# Patient Record
Sex: Female | Born: 1960 | Race: White | Hispanic: No | Marital: Single | Smoking: Former smoker
Health system: Southern US, Community
[De-identification: ages and names within clinical notes are randomized; demographics above are authoritative.]

## PROBLEM LIST (undated history)

## (undated) DIAGNOSIS — E119 Type 2 diabetes mellitus without complications: Secondary | ICD-10-CM

## (undated) DIAGNOSIS — J309 Allergic rhinitis, unspecified: Secondary | ICD-10-CM

## (undated) DIAGNOSIS — G4733 Obstructive sleep apnea (adult) (pediatric): Secondary | ICD-10-CM

## (undated) DIAGNOSIS — F32A Depression, unspecified: Secondary | ICD-10-CM

## (undated) DIAGNOSIS — R112 Nausea with vomiting, unspecified: Secondary | ICD-10-CM

## (undated) DIAGNOSIS — F419 Anxiety disorder, unspecified: Secondary | ICD-10-CM

## (undated) DIAGNOSIS — H5210 Myopia, unspecified eye: Secondary | ICD-10-CM

## (undated) DIAGNOSIS — J45909 Unspecified asthma, uncomplicated: Secondary | ICD-10-CM

## (undated) DIAGNOSIS — H409 Unspecified glaucoma: Secondary | ICD-10-CM

## (undated) DIAGNOSIS — Z6841 Body Mass Index (BMI) 40.0 and over, adult: Secondary | ICD-10-CM

## (undated) DIAGNOSIS — E559 Vitamin D deficiency, unspecified: Secondary | ICD-10-CM

## (undated) DIAGNOSIS — J9801 Acute bronchospasm: Secondary | ICD-10-CM

## (undated) DIAGNOSIS — M1990 Unspecified osteoarthritis, unspecified site: Secondary | ICD-10-CM

## (undated) DIAGNOSIS — Z9889 Other specified postprocedural states: Secondary | ICD-10-CM

## (undated) DIAGNOSIS — F329 Major depressive disorder, single episode, unspecified: Secondary | ICD-10-CM

## (undated) DIAGNOSIS — I10 Essential (primary) hypertension: Secondary | ICD-10-CM

## (undated) DIAGNOSIS — F418 Other specified anxiety disorders: Secondary | ICD-10-CM

## (undated) DIAGNOSIS — Z9989 Dependence on other enabling machines and devices: Secondary | ICD-10-CM

## (undated) HISTORY — DX: Type 2 diabetes mellitus without complications: E11.9

## (undated) HISTORY — DX: Dependence on other enabling machines and devices: Z99.89

## (undated) HISTORY — DX: Unspecified osteoarthritis, unspecified site: M19.90

## (undated) HISTORY — DX: Other specified anxiety disorders: F41.8

## (undated) HISTORY — DX: Obstructive sleep apnea (adult) (pediatric): G47.33

## (undated) HISTORY — DX: Vitamin D deficiency, unspecified: E55.9

## (undated) HISTORY — DX: Essential (primary) hypertension: I10

## (undated) HISTORY — PX: OTHER SURGICAL HISTORY: SHX169

## (undated) HISTORY — DX: Acute bronchospasm: J98.01

## (undated) HISTORY — PX: KNEE SURGERY: SHX244

## (undated) HISTORY — DX: Body Mass Index (BMI) 40.0 and over, adult: Z684

## (undated) HISTORY — DX: Morbid (severe) obesity due to excess calories: E66.01

## (undated) HISTORY — DX: Myopia, unspecified eye: H52.10

## (undated) HISTORY — DX: Allergic rhinitis, unspecified: J30.9

---

## 1997-02-03 HISTORY — PX: CHOLECYSTECTOMY: SHX55

## 1998-01-04 ENCOUNTER — Encounter: Admission: RE | Admit: 1998-01-04 | Discharge: 1998-01-04 | Payer: Self-pay | Admitting: Internal Medicine

## 1998-06-20 ENCOUNTER — Other Ambulatory Visit: Admission: RE | Admit: 1998-06-20 | Discharge: 1998-06-20 | Payer: Self-pay | Admitting: Obstetrics & Gynecology

## 1998-11-06 ENCOUNTER — Encounter: Payer: Self-pay | Admitting: Obstetrics & Gynecology

## 1998-11-06 ENCOUNTER — Ambulatory Visit (HOSPITAL_COMMUNITY): Admission: RE | Admit: 1998-11-06 | Discharge: 1998-11-06 | Payer: Self-pay | Admitting: Obstetrics & Gynecology

## 1999-02-04 HISTORY — PX: TUBAL LIGATION: SHX77

## 1999-11-26 ENCOUNTER — Other Ambulatory Visit: Admission: RE | Admit: 1999-11-26 | Discharge: 1999-11-26 | Payer: Self-pay | Admitting: Obstetrics & Gynecology

## 2000-09-25 ENCOUNTER — Ambulatory Visit (HOSPITAL_COMMUNITY): Admission: RE | Admit: 2000-09-25 | Discharge: 2000-09-25 | Payer: Self-pay | Admitting: Obstetrics & Gynecology

## 2000-12-01 ENCOUNTER — Other Ambulatory Visit: Admission: RE | Admit: 2000-12-01 | Discharge: 2000-12-01 | Payer: Self-pay | Admitting: Obstetrics & Gynecology

## 2001-11-05 ENCOUNTER — Other Ambulatory Visit: Admission: RE | Admit: 2001-11-05 | Discharge: 2001-11-05 | Payer: Self-pay | Admitting: Obstetrics & Gynecology

## 2003-01-05 ENCOUNTER — Other Ambulatory Visit: Admission: RE | Admit: 2003-01-05 | Discharge: 2003-01-05 | Payer: Self-pay | Admitting: Obstetrics & Gynecology

## 2003-02-21 ENCOUNTER — Ambulatory Visit (HOSPITAL_COMMUNITY): Admission: RE | Admit: 2003-02-21 | Discharge: 2003-02-21 | Payer: Self-pay | Admitting: Obstetrics & Gynecology

## 2003-04-25 ENCOUNTER — Other Ambulatory Visit: Admission: RE | Admit: 2003-04-25 | Discharge: 2003-04-25 | Payer: Self-pay | Admitting: Obstetrics & Gynecology

## 2004-03-13 ENCOUNTER — Other Ambulatory Visit: Admission: RE | Admit: 2004-03-13 | Discharge: 2004-03-13 | Payer: Self-pay | Admitting: Obstetrics & Gynecology

## 2004-07-18 ENCOUNTER — Encounter: Admission: RE | Admit: 2004-07-18 | Discharge: 2004-07-18 | Payer: Self-pay | Admitting: Obstetrics & Gynecology

## 2004-09-30 ENCOUNTER — Other Ambulatory Visit: Admission: RE | Admit: 2004-09-30 | Discharge: 2004-09-30 | Payer: Self-pay | Admitting: Obstetrics & Gynecology

## 2005-03-24 ENCOUNTER — Other Ambulatory Visit: Admission: RE | Admit: 2005-03-24 | Discharge: 2005-03-24 | Payer: Self-pay | Admitting: Obstetrics & Gynecology

## 2005-07-09 ENCOUNTER — Encounter: Admission: RE | Admit: 2005-07-09 | Discharge: 2005-07-09 | Payer: Self-pay | Admitting: Obstetrics & Gynecology

## 2006-04-18 ENCOUNTER — Emergency Department (HOSPITAL_COMMUNITY): Admission: EM | Admit: 2006-04-18 | Discharge: 2006-04-18 | Payer: Self-pay | Admitting: Family Medicine

## 2006-07-15 ENCOUNTER — Encounter: Admission: RE | Admit: 2006-07-15 | Discharge: 2006-07-15 | Payer: Self-pay | Admitting: Obstetrics & Gynecology

## 2007-10-07 ENCOUNTER — Encounter: Admission: RE | Admit: 2007-10-07 | Discharge: 2007-11-30 | Payer: Self-pay | Admitting: Orthopedic Surgery

## 2008-05-24 ENCOUNTER — Encounter: Admission: RE | Admit: 2008-05-24 | Discharge: 2008-07-06 | Payer: Self-pay | Admitting: Orthopedic Surgery

## 2009-02-03 HISTORY — PX: DILATION AND CURETTAGE OF UTERUS: SHX78

## 2009-09-28 ENCOUNTER — Ambulatory Visit (HOSPITAL_COMMUNITY): Admission: RE | Admit: 2009-09-28 | Discharge: 2009-09-28 | Payer: Self-pay | Admitting: Obstetrics & Gynecology

## 2010-02-24 ENCOUNTER — Encounter: Payer: Self-pay | Admitting: Obstetrics & Gynecology

## 2010-02-25 ENCOUNTER — Encounter: Payer: Self-pay | Admitting: Obstetrics & Gynecology

## 2010-04-18 LAB — BASIC METABOLIC PANEL
Calcium: 9.5 mg/dL (ref 8.4–10.5)
Creatinine, Ser: 0.59 mg/dL (ref 0.4–1.2)
GFR calc Af Amer: >60 mL/min (ref >60)
GFR calc non Af Amer: >60 mL/min (ref >60)
Glucose, Bld: 104 mg/dL — ABNORMAL HIGH (ref 70–99)
Sodium: 138 mEq/L (ref 135–145)

## 2010-04-18 LAB — CBC
MCH: 31.3 pg (ref 26.0–34.0)
MCHC: 34.5 g/dL (ref 30.0–36.0)
RDW: 13.4 % (ref 11.5–15.5)

## 2010-06-21 NOTE — Op Note (Signed)
[facility]  Patient:    Kim Brock, Kim Brock Visit Number: [id number] MRN: [id number]          Service Type: DSU Location: [facility] Attending Physician:  Minette Headland Proc. Date: 09/25/00 Adm. Date:  09/25/2000                             Operative Report  PREOPERATIVE DIAGNOSES:       Request for surgical sterilization.  POSTOPERATIVE DIAGNOSES:      Request for surgical sterilization with findings of small intramural leiomyomata.  PROCEDURE:                    Laparoscopic tubal ligation with application of ______ clips.  SURGEON:                      Freddy Finner, M.D.  ANESTHESIA:                   General.  INTRAOPERATIVE COMPLICATIONS: None.  ESTIMATED BLOOD LOSS:         5 cc.  HISTORY:                      Patient is a 50 year old divorced female gravida 1, para 1 who has definitively decided that she does not wish to ever have another pregnancy.  She is involved in a relationship at the present time and does not want any risk of pregnancy.  She has requested sterilization and is admitted at this time for that purpose.  FINDINGS:                     Irregular enlargement of the uterus to approximately 8-9 weeks size most consistent with small intramural leiomyomata.  No other visible abnormalities were noted in the pelvis.  The appendix could not be visualized ______ technique.  No apparent abnormalities were noted in the upper abdomen.  Findings recorded and still photographs which were retained in the office record.  Patient was admitted on the morning of surgery, brought to the operating room, placed under adequate general endotracheal anesthesia.  Placed in the dorsal lithotomy position.  Betadine prep was carried out in the usual fashion. Bladder was evacuated with sterile technique using Robinson catheter.  Hulka tenaculum was attached to the cervix.  Sterile drapes were applied.  An infraumbilical skin incision was  made through an old scar.  An 11 mm disposable trocar was introduced without difficulty while elevating anterior abdominal wall manually.  Direct inspection revealed adequate placement with no evidence of injury on entry.  Pneumoperitoneum was allowed to accumulate with carbon dioxide gas.  Patient was placed in ______ Trendelenburg position.  Uterus was elevated with the Hulka tenaculum.  Systematic examination was carried out with findings as noted above.  The ______ clip applying device was ______ through the operating channel of the laparoscope. A clip was applied to the ______ portion of the each tube without difficulty, without bleeding or injury.  At this point the procedure was terminated.  Gas was allowed to escape from the abdomen.  Marcaine 0.5% was injected into the incision site for postoperative analgesia.  Patient was given 30 mg of Toradol IV.  The skin incision was closed with interrupted subcuticular sutures of 3-0 Dexon.  Patient tolerated procedure well and was taken to recovery in good condition.  She will be discharged with  routine outpatient surgical instructions for followup in the office in approximately two weeks.  She is given Vicodin to be taken p.r.n. for pain. Attending Physician:  Minette Headland DD:  09/25/00 TD:  09/25/00 Job: [id number]

## 2011-02-07 ENCOUNTER — Other Ambulatory Visit: Payer: Self-pay | Admitting: Obstetrics & Gynecology

## 2011-08-15 ENCOUNTER — Other Ambulatory Visit: Payer: Self-pay | Admitting: Orthopedic Surgery

## 2011-08-15 ENCOUNTER — Ambulatory Visit (INDEPENDENT_AMBULATORY_CARE_PROVIDER_SITE_OTHER): Payer: Self-pay

## 2011-08-15 DIAGNOSIS — R52 Pain, unspecified: Secondary | ICD-10-CM

## 2011-08-15 DIAGNOSIS — R29898 Other symptoms and signs involving the musculoskeletal system: Secondary | ICD-10-CM

## 2011-08-15 DIAGNOSIS — M1710 Unilateral primary osteoarthritis, unspecified knee: Secondary | ICD-10-CM

## 2011-08-15 DIAGNOSIS — IMO0002 Reserved for concepts with insufficient information to code with codable children: Secondary | ICD-10-CM

## 2011-12-04 ENCOUNTER — Encounter (INDEPENDENT_AMBULATORY_CARE_PROVIDER_SITE_OTHER): Payer: Self-pay | Admitting: General Surgery

## 2011-12-04 ENCOUNTER — Ambulatory Visit (INDEPENDENT_AMBULATORY_CARE_PROVIDER_SITE_OTHER): Payer: 59 | Admitting: General Surgery

## 2011-12-04 DIAGNOSIS — Z6841 Body Mass Index (BMI) 40.0 and over, adult: Secondary | ICD-10-CM

## 2011-12-04 DIAGNOSIS — E119 Type 2 diabetes mellitus without complications: Secondary | ICD-10-CM

## 2011-12-04 DIAGNOSIS — G4733 Obstructive sleep apnea (adult) (pediatric): Secondary | ICD-10-CM

## 2011-12-04 DIAGNOSIS — Z9989 Dependence on other enabling machines and devices: Secondary | ICD-10-CM

## 2011-12-04 DIAGNOSIS — I10 Essential (primary) hypertension: Secondary | ICD-10-CM

## 2011-12-04 NOTE — Patient Instructions (Signed)
We will start our evaluation process

## 2011-12-04 NOTE — Progress Notes (Signed)
Patient ID: Kim Brock, female   DOB: [birth year], 51 y.o.   MRN: [id number]  Chief Complaint  Patient presents with  . Bariatric Pre-op    HPI Kim Brock is a 51 y.o. female.   HPI 51 year old morbidly obese Caucasian female referred by Dr. Cliffton Asters for evaluation for weight loss surgery. The patient states that she has done a lot of research over the past several years regarding weight loss surgery and she feels that the LAP-BAND is the most appropriate procedure for her. 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 She also states that she feels that there's not enough long-term data regarding the sleeve gastrectomy.  She states that she has struggled with her weight all of her adult life. Despite numerous attempts for sustained weight loss she has been unsuccessful. She has tried Weight Watchers, Slim fast, and phentermine all without any long-term success. 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  She states that she currently is not able to get a lot of exercise because of severe bilateral knee pain. She states that she has seen an orthopedic surgeon, Dr. Charlann Boxer, who has recommended weight loss prior to any type of surgical intervention with respect to her knees. Past Medical History  Diagnosis Date  . Arthritis   . Diabetes mellitus without complication   . Hypertension   . OSA on CPAP     Past Surgical History  Procedure Date  . Cholecystectomy   . Tubal ligation   . Knee surgery 2003/2009    left and right knee  . Dilation and curettage of uterus     History reviewed. No pertinent family history.  Social History History  Substance Use Topics  . Smoking status: Former Smoker    Quit date: 08/06/1991  . Smokeless tobacco: Not on file  . Alcohol Use: Yes     1-2 drinks     Allergies  Allergen Reactions  . Demerol (Meperidine)   . Epinephrine   . Tape     Steri strips    Current Outpatient  Prescriptions  Medication Sig Dispense Refill  . amLODipine (NORVASC) 2.5 MG tablet Take 2.5 mg by mouth daily.      . diclofenac (VOLTAREN) 75 MG EC tablet       . escitalopram (LEXAPRO) 10 MG tablet Take 15 mg by mouth daily.      Marland Kitchen lisinopril-hydrochlorothiazide (PRINZIDE,ZESTORETIC) 20-12.5 MG per tablet         Review of Systems Review of Systems  Constitutional: Negative for fever, activity change, appetite change and unexpected weight change.  HENT: Negative for hearing loss, nosebleeds, neck pain and neck stiffness.   Eyes: Negative for photophobia and visual disturbance.  Respiratory: Negative for chest tightness, shortness of breath and wheezing.        +OSA on CPAP, managed by Dr Armanda Magic  Cardiovascular:       Denies CP, SOB, DOE, orthopnea, PND; some ankle swelling  Gastrointestinal: Negative for nausea, vomiting, abdominal pain, diarrhea and constipation.       Denies reflux; had colonoscopy - wnl; s/p lap cholecystectomy; certain foods will give pt diarrhea  Genitourinary: Negative for dysuria, hematuria, difficulty urinating and pelvic pain.       Has had some problems with vaginal bleeding due to uterine polyps. Had ablation in Jan - no problems since than. G1P1  Musculoskeletal:       B/l knee pain - "  no cartilage in knees" had xrays at Dr Nilsa Nutting office  Skin: Negative for color change and pallor.  Neurological: Negative for dizziness, tremors, seizures, syncope, speech difficulty, weakness and headaches.       Denies TIA, amaurosis fugax  Hematological: Negative for adenopathy. Does not bruise/bleed easily.  Psychiatric/Behavioral: Negative for hallucinations, behavioral problems and agitation. The patient is not hyperactive.        Sees a therapist    Blood pressure 144/88, pulse 80, temperature 98 F (36.7 C), temperature source Temporal, resp. rate 18, height 5' 5.5" (1.664 m), weight 358 lb 9.6 oz (162.66 kg).  Physical Exam Physical Exam  Vitals  reviewed. Constitutional: She is oriented to person, place, and time. She appears well-developed and well-nourished. No distress.       Smiling, pleasant; morbidly obese  HENT:  Head: Normocephalic and atraumatic.  Right Ear: External ear normal.  Eyes: Conjunctivae normal and EOM are normal. Pupils are equal, round, and reactive to light. No scleral icterus.  Neck: Normal range of motion. Neck supple. No tracheal deviation present. No thyromegaly present.  Cardiovascular: Normal rate, regular rhythm and normal heart sounds.   No murmur heard. Pulmonary/Chest: Effort normal and breath sounds normal. No respiratory distress. She has no wheezes. She has no rales.  Abdominal: Soft. Bowel sounds are normal. She exhibits no distension. There is no tenderness. There is no rebound and no guarding.       Well healed trocar sites  Musculoskeletal: Normal range of motion. She exhibits no edema.  Lymphadenopathy:    She has no cervical adenopathy.  Neurological: She is alert and oriented to person, place, and time. No cranial nerve deficit.  Skin: Skin is warm and dry. No rash noted. She is not diaphoretic. No erythema. No pallor.  Psychiatric: She has a normal mood and affect. Her behavior is normal. Judgment and thought content normal.    Data Reviewed Pt's personal letter Pt's self reported diet history  Assessment    Patient Active Problem List  Diagnosis  . Morbid obesity with BMI of 50.0-59.9, adult  . HTN (hypertension)  . OSA on CPAP  . Diabetes mellitus  * Degenerative joint disease - knees      Plan    The patient meets weight loss surgery criteria. I think the patient would be an acceptable candidate for Laparoscopic adjustable gastric band placement.  We discussed laparoscopic adjustable gastric banding. The patient was given Agricultural engineer. We discussed the risk and benefits of surgery including but not limited to bleeding, infection, injury to surrounding structures,  blood clot formation such as deep venous thrombosis or pulmonary embolism, need to convert to an open procedure, band slippage, band erosion, failure to loose weight, port complications (leak or flippage), potential need for reoperative surgery, esophageal dilatation, worsening reflux, and vitamin deficiencies. We discussed the typical post operative recovery course. We discussed that their postoperative diet will be modified for several weeks. We specifically talked about the need to be on a liquid diet for one to 2 weeks after surgery. We also discussed the typical postoperative course with a laparoscopic adjustable gastric band and the need for frequent postoperative visits to assess the volume status of the band.  We discussed the typical expected weight loss with a laparoscopic adjustable gastric band. I explained to the patient that they can expect to lose 40-60% of their excess body weight if they are compliant with their postoperative instructions. However I did explain that some patients loose less than  40% and some patients lose more than 60% of their excess body weight.  I explained that the likelihood of improvement in their obesity is good.  I explained to the patient that we will start our evaluation process which includes labs, Upper GI to evaluate stomach and swallowing anatomy, nutritionist consultation, psychiatrist consultation, EKG, CXR.  Mary Sella. Andrey Campanile, MD, FACS General, Bariatric, & Minimally Invasive Surgery [facility], [place]          [facility] M 12/04/2011, 12:50 PM

## 2011-12-12 ENCOUNTER — Ambulatory Visit (HOSPITAL_COMMUNITY)
Admission: RE | Admit: 2011-12-12 | Discharge: 2011-12-12 | Disposition: A | Discharge status: Home / Self Care | Payer: 59 | Source: Ambulatory Visit | Attending: General Surgery | Admitting: General Surgery

## 2011-12-12 ENCOUNTER — Other Ambulatory Visit: Payer: Self-pay

## 2011-12-12 DIAGNOSIS — E119 Type 2 diabetes mellitus without complications: Secondary | ICD-10-CM | POA: Insufficient documentation

## 2011-12-12 DIAGNOSIS — Z01810 Encounter for preprocedural cardiovascular examination: Secondary | ICD-10-CM | POA: Insufficient documentation

## 2011-12-12 DIAGNOSIS — I10 Essential (primary) hypertension: Secondary | ICD-10-CM | POA: Insufficient documentation

## 2011-12-12 DIAGNOSIS — Z01818 Encounter for other preprocedural examination: Secondary | ICD-10-CM | POA: Insufficient documentation

## 2011-12-29 ENCOUNTER — Encounter: Payer: Self-pay | Admitting: *Deleted

## 2011-12-29 ENCOUNTER — Encounter: Payer: 59 | Attending: General Surgery | Admitting: *Deleted

## 2011-12-29 VITALS — Ht 65.5 in | Wt 361.9 lb

## 2011-12-29 DIAGNOSIS — Z713 Dietary counseling and surveillance: Secondary | ICD-10-CM | POA: Insufficient documentation

## 2011-12-29 DIAGNOSIS — Z01818 Encounter for other preprocedural examination: Secondary | ICD-10-CM | POA: Insufficient documentation

## 2011-12-29 DIAGNOSIS — Z6841 Body Mass Index (BMI) 40.0 and over, adult: Secondary | ICD-10-CM

## 2011-12-29 NOTE — Patient Instructions (Addendum)
   Follow Pre-Op Nutrition Goals to prepare for Lapband Surgery.   Call the [facility] at [phone] once you have been given your surgery date to enrolled in the Pre-Op Nutrition Class. You will need to attend this nutrition class 3-4 weeks prior to your surgery. 

## 2011-12-29 NOTE — Progress Notes (Signed)
  Pre-Op Assessment Visit:  Pre-Operative LAGB Surgery  Medical Nutrition Therapy:  Appt start time: 0930   End time:  1030.  Patient was seen on 12/29/2011 for Pre-Operative LAGB Nutrition Assessment. Assessment and letter of approval faxed to [facility] Bariatric Surgery Program coordinator on 12/29/2011.  Approval letter sent to [facility] center and will be available in the chart under the media tab.  Handouts given during visit include:  Pre-Op Goals   Bariatric Surgery Protein Shakes  Samples given during visit include:   Premier Protein shake: 1 bottle Lot # [id number]; Exp: 11/21/12  Unjury Chicken Soup: 1 ea Lot # [id number]; 03/15  Unjury Unflavored: 1 ea Lot # [id number]; Exp: 10/14  Patient to call for Pre-Op and Post-Op Nutrition Education at the [facility] when surgery is scheduled.

## 2011-12-31 ENCOUNTER — Other Ambulatory Visit: Payer: Self-pay | Admitting: Obstetrics & Gynecology

## 2011-12-31 DIAGNOSIS — Z1231 Encounter for screening mammogram for malignant neoplasm of breast: Secondary | ICD-10-CM

## 2012-01-06 LAB — CBC WITH DIFFERENTIAL/PLATELET
Basophils Relative: 1 % (ref 0–1)
Hemoglobin: 13.5 g/dL (ref 12.0–15.0)
MCHC: 34.1 g/dL (ref 30.0–36.0)
Monocytes Relative: 8 % (ref 3–12)
Neutro Abs: 3.7 10*3/uL (ref 1.7–7.7)
Neutrophils Relative %: 61 % (ref 43–77)
Platelets: 257 10*3/uL (ref 150–400)
RBC: 4.55 MIL/uL (ref 3.87–5.11)

## 2012-01-06 LAB — COMPREHENSIVE METABOLIC PANEL
ALT: 41 U/L — ABNORMAL HIGH (ref 0–35)
AST: 26 U/L (ref 0–37)
Albumin: 4.3 g/dL (ref 3.5–5.2)
Alkaline Phosphatase: 57 U/L (ref 39–117)
Glucose, Bld: 118 mg/dL — ABNORMAL HIGH (ref 70–99)
Potassium: 4.7 mEq/L (ref 3.5–5.3)
Sodium: 141 mEq/L (ref 135–145)
Total Bilirubin: 0.3 mg/dL (ref 0.3–1.2)
Total Protein: 6.4 g/dL (ref 6.0–8.3)

## 2012-01-06 LAB — LIPID PANEL
LDL Cholesterol: 71 mg/dL (ref 0–99)
Triglycerides: 157 mg/dL — ABNORMAL HIGH (ref <150)
VLDL: 31 mg/dL (ref 0–40)

## 2012-01-06 LAB — TSH: TSH: 2.436 u[IU]/mL (ref 0.350–4.500)

## 2012-01-06 LAB — T4: T4, Total: 7.5 ug/dL (ref 5.0–12.5)

## 2012-01-07 LAB — H. PYLORI ANTIBODY, IGG: H Pylori IgG: 0.52 {ISR}

## 2012-01-08 ENCOUNTER — Encounter: Payer: 59 | Attending: General Surgery | Admitting: *Deleted

## 2012-01-08 ENCOUNTER — Encounter: Payer: Self-pay | Admitting: *Deleted

## 2012-01-08 VITALS — Ht 65.5 in | Wt 361.1 lb

## 2012-01-08 DIAGNOSIS — Z01818 Encounter for other preprocedural examination: Secondary | ICD-10-CM | POA: Insufficient documentation

## 2012-01-08 DIAGNOSIS — Z6841 Body Mass Index (BMI) 40.0 and over, adult: Secondary | ICD-10-CM

## 2012-01-08 DIAGNOSIS — Z713 Dietary counseling and surveillance: Secondary | ICD-10-CM | POA: Insufficient documentation

## 2012-01-08 NOTE — Progress Notes (Signed)
Bariatric Class:  Appt start time: 1730 end time:  1830.  Pre-Operative Nutrition Class  Patient was seen on 01/08/2012 for Pre-Operative Bariatric Surgery Education at the [facility].   Surgery date: 02/02/12 Surgery type: LAGB Start weight at [facility]: 361.9 lbs (12/29/11) Goal weight: 180-200 lbs  Weight today: 361.1 lbs Weight change: n/a Total weight lost: n/a BMI: 59.3  Samples given per MNT protocol: Bariatric Advantage Multivitamin Lot # [id number];  Exp: 06/15  Bariatric Advantage Calcium Citrate Lot # [id number];  Exp: 12/13 (*Pt alerted to upcoming expiration date)  Celebrate Vitamins Multivitamin Lot # [id number];  Exp: 07/14  Celebrate Vitamins Iron + C (18 mg) Lot # [id number];  Exp: 03/15  Celebrate Vitamins Calcium Citrate Lot # [id number];  Exp: 08/15  Corliss Marcus Protein Shake Lot # [id number];  Exp: 02/15  Premier Protein Shake Lot # [id number];  Exp: 11/21/12  The following the learning objective met by the patient during this course:  Identifies Pre-Op Dietary Goals and will begin 2 weeks pre-operatively  Identifies appropriate sources of fluids and proteins   States protein recommendations and appropriate sources pre and post-operatively  Identifies Post-Operative Dietary Goals and will follow for 2 weeks post-operatively  Identifies appropriate multivitamin and calcium sources  Describes the need for physical activity post-operatively and will follow MD recommendations  States when to call healthcare provider regarding medication questions or post-operative complications  Handouts given during class include:  Pre-Op Bariatric Surgery Diet Handout  Protein Shake Handout  Post-Op Bariatric Surgery Nutrition Handout  BELT Program Information Flyer  Support Group Information Flyer  [facility] Bariatric Supplements Price List  Follow-Up Plan: Patient will follow-up at [facility] 2 weeks post operatively for diet advancement per  MD.

## 2012-01-08 NOTE — Patient Instructions (Signed)
Follow:   Pre-Op Diet per MD 2 weeks prior to surgery  Phase 2- Liquids (clear/full) 2 weeks after surgery  Vitamin/Mineral/Calcium guidelines for purchasing bariatric supplements  Exercise guidelines pre and post-op per MD  Follow-up at [facility] in 2 weeks post-op for diet advancement. Contact Ellen Mayol as needed with questions/concerns. 

## 2012-01-12 ENCOUNTER — Encounter (INDEPENDENT_AMBULATORY_CARE_PROVIDER_SITE_OTHER): Payer: Self-pay

## 2012-02-09 ENCOUNTER — Ambulatory Visit: Payer: 59

## 2012-02-11 ENCOUNTER — Other Ambulatory Visit (INDEPENDENT_AMBULATORY_CARE_PROVIDER_SITE_OTHER): Payer: Self-pay | Admitting: General Surgery

## 2012-02-17 ENCOUNTER — Ambulatory Visit: Payer: 59

## 2012-03-22 ENCOUNTER — Encounter (HOSPITAL_COMMUNITY): Payer: Self-pay | Admitting: Pharmacy Technician

## 2012-03-24 ENCOUNTER — Other Ambulatory Visit (HOSPITAL_COMMUNITY): Payer: Self-pay | Admitting: General Surgery

## 2012-03-24 NOTE — Progress Notes (Signed)
ekg and 2 view chest xray 12-12-2011 epic

## 2012-03-24 NOTE — Patient Instructions (Addendum)
[address] Sailer  03/24/2012   Your procedure is scheduled on: 03-30-2012  Report to [facility] at 0745 AM.  Call this number if you have problems the morning of surgery [phone]   Remember:BRING CPAP MASK AND TUBING   Do not eat food or drink liquids :After Midnight.     Take these medicines the morning of surgery with A SIP OF WATER: amlodipine, lexapro                                SEE [facility] PREPARING FOR SURGERY SHEET   Do not wear jewelry, make-up or nail polish.  Do not wear lotions, powders, or perfumes. You may wear deodorant.   Men may shave face and neck.  Do not bring valuables to the hospital.  Contacts, dentures or bridgework may not be worn into surgery.  Leave suitcase in the car. After surgery it may be brought to your room.  For patients admitted to the hospital, checkout time is 11:00 AM the day of discharge.   Patients discharged the day of surgery will not be allowed to drive home.  Name and phone number of your driver:  Special Instructions: N/A   Please read over the following fact sheets that you were given: MRSA Information.  Call Cain Sieve RN pre op nurse if needed [phone]    FAILURE TO FOLLOW THESE INSTRUCTIONS MAY RESULT IN THE CANCELLATION OF YOUR SURGERY. PATIENT SIGNATURE___________________________________________

## 2012-03-25 ENCOUNTER — Encounter (INDEPENDENT_AMBULATORY_CARE_PROVIDER_SITE_OTHER): Payer: Self-pay | Admitting: General Surgery

## 2012-03-25 ENCOUNTER — Ambulatory Visit (INDEPENDENT_AMBULATORY_CARE_PROVIDER_SITE_OTHER): Payer: 59 | Admitting: General Surgery

## 2012-03-25 ENCOUNTER — Encounter (HOSPITAL_COMMUNITY)
Admission: RE | Admit: 2012-03-25 | Discharge: 2012-03-25 | Disposition: A | Discharge status: Home / Self Care | Payer: 59 | Source: Ambulatory Visit | Attending: General Surgery | Admitting: General Surgery

## 2012-03-25 ENCOUNTER — Encounter (HOSPITAL_COMMUNITY): Payer: Self-pay

## 2012-03-25 VITALS — BP 130/72 | HR 84 | Resp 20 | Ht 65.5 in | Wt 361.1 lb

## 2012-03-25 DIAGNOSIS — Z6841 Body Mass Index (BMI) 40.0 and over, adult: Secondary | ICD-10-CM

## 2012-03-25 HISTORY — DX: Nausea with vomiting, unspecified: R11.2

## 2012-03-25 HISTORY — DX: Other specified postprocedural states: Z98.890

## 2012-03-25 LAB — CBC WITH DIFFERENTIAL/PLATELET
Eosinophils Absolute: 0.1 10*3/uL (ref 0.0–0.7)
Eosinophils Relative: 2 % (ref 0–5)
HCT: 39.8 % (ref 36.0–46.0)
Hemoglobin: 13.5 g/dL (ref 12.0–15.0)
Lymphocytes Relative: 30 % (ref 12–46)
Lymphs Abs: 1.7 10*3/uL (ref 0.7–4.0)
MCH: 29.7 pg (ref 26.0–34.0)
MCV: 87.7 fL (ref 78.0–100.0)
Monocytes Absolute: 0.5 10*3/uL (ref 0.1–1.0)
Monocytes Relative: 8 % (ref 3–12)
RBC: 4.54 MIL/uL (ref 3.87–5.11)
WBC: 5.9 10*3/uL (ref 4.0–10.5)

## 2012-03-25 LAB — COMPREHENSIVE METABOLIC PANEL
ALT: 50 U/L — ABNORMAL HIGH (ref 0–35)
BUN: 15 mg/dL (ref 6–23)
CO2: 29 mEq/L (ref 19–32)
Calcium: 9.3 mg/dL (ref 8.4–10.5)
Creatinine, Ser: 0.7 mg/dL (ref 0.50–1.10)
GFR calc Af Amer: >90 mL/min (ref >90)
GFR calc non Af Amer: >90 mL/min (ref >90)
Glucose, Bld: 150 mg/dL — ABNORMAL HIGH (ref 70–99)
Sodium: 137 mEq/L (ref 135–145)

## 2012-03-25 NOTE — Progress Notes (Signed)
Pt did not wish to have serum pregnany test, stated no sexual intercourse x 4 years last menustral cycle jan 2013 after ablation. Please enter order in epic if you want serum pregnancy am of surgery.

## 2012-03-25 NOTE — Progress Notes (Signed)
roted note to dr Andrey Campanile by epic pt refused serum pregnancy

## 2012-03-25 NOTE — Patient Instructions (Addendum)
Keep up with the preop diet  

## 2012-03-25 NOTE — Progress Notes (Signed)
Patient ID: Kim Brock, female   DOB: Dec 01, 1960, 52 y.o.   MRN: [id number]  Chief Complaint  Patient presents with  . Bariatric Pre-op    lap band scheduled 03/30/2012    HPI Kim Brock is a 52 y.o. female.   HPI 53 year old morbidly obese Caucasian female comes in today for her preoperative appointment. She is currently scheduled to undergo laparoscopic adjustable gastric band placement on February 25. She was last seen in the office at the end of October 2013. She denies any changes to her past medical history. She states only thing that has been an issue of late has been her blood sugars have been running in the 140s to low 160s. She is attributing it to stress at work.  She states that Dr. Mayford Knife wants to manage her CPAP postoperatively as she loses weight.  She states that she started her preoperative diet. She is still very limited on the amount of exercise that she can do because of her severe bilateral knee pain Past Medical History  Diagnosis Date  . Arthritis   . Hypertension   . Morbid obesity with BMI of 50.0-59.9, adult     BMI 59.3  . OSA on CPAP     uses cpap setting of 11  . Diabetes mellitus without complication     diet controlled  . PONV (postoperative nausea and vomiting)     nausea likes zofran, pt has motion sickness    Past Surgical History  Procedure Laterality Date  . Knee surgery  2009/2010    left and right knee  . Dilation and curettage of uterus  2011  . Cholecystectomy  1999  . Tubal ligation  2001    Family History  Problem Relation Age of Onset  . Diabetes Mother   . Hypertension Mother   . Osteoarthritis Mother   . Hyperlipidemia Father   . Cancer Paternal Grandmother     Colon    Social History History  Substance Use Topics  . Smoking status: Former Smoker -- 1.00 packs/day for 11 years    Types: Cigarettes    Quit date: 08/06/1991  . Smokeless tobacco: Never Used  . Alcohol Use: 0.0 oz/week    1-2 Glasses of wine  per week     Comment: 1-2 drinks     Allergies  Allergen Reactions  . Demerol (Meperidine) Other (See Comments)    Blood pressure drops and makes me violently nauseous.  Marland Kitchen Epinephrine Other (See Comments)    In Novocaine - makes jittery and heart race  . Percocet (Oxycodone-Acetaminophen)     Family h/o of poor tolerance.  Will not take - can tolerate Vicodin  . Tape Other (See Comments)    Steri strips - blisters    Current Outpatient Prescriptions  Medication Sig Dispense Refill  . albuterol (PROVENTIL HFA;VENTOLIN HFA) 108 (90 BASE) MCG/ACT inhaler Inhale 2 puffs into the lungs every 4 (four) hours as needed for shortness of breath.       Marland Kitchen amLODipine (NORVASC) 2.5 MG tablet Take 2.5 mg by mouth every morning.       . clonazePAM (KLONOPIN) 0.5 MG tablet Take 0.25 mg by mouth 3 (three) times a week.       . diclofenac (VOLTAREN) 75 MG EC tablet Take 75 mg by mouth 2 (two) times daily.       Marland Kitchen escitalopram (LEXAPRO) 20 MG tablet Take 20 mg by mouth daily.      Marland Kitchen lisinopril-hydrochlorothiazide (PRINZIDE,ZESTORETIC) 20-12.5  MG per tablet Take 1 tablet by mouth daily.       . Multiple Vitamin (MULTIVITAMIN WITH MINERALS) TABS Take 1 tablet by mouth daily. Pt has not started yet       No current facility-administered medications for this visit.    Review of Systems Review of Systems  Constitutional: Negative for fever, appetite change, fatigue and unexpected weight change.  HENT: Negative for hearing loss, nosebleeds and neck pain.   Respiratory:       +OSA on CPAP, managed Dr Cherlyn Labella  Cardiovascular: Negative for chest pain, palpitations and leg swelling.       Denies CP, SOB, orthopnea, PND  Gastrointestinal: Negative for abdominal pain, diarrhea, constipation and abdominal distention.  Genitourinary: Negative for dysuria and difficulty urinating.  Musculoskeletal:       B/l knee pain  Neurological: Negative for dizziness, tremors, seizures, facial asymmetry, weakness  and numbness.  Psychiatric/Behavioral: Negative for behavioral problems and agitation. The patient is not hyperactive.        Lots of stress at work lately    Blood pressure 130/72, pulse 84, resp. rate 20, height 5' 5.5" (1.664 m), weight 361 lb 2 oz (163.805 kg), last menstrual period 02/04/2011.  Physical Exam Physical Exam  Vitals reviewed. Constitutional: She is oriented to person, place, and time. She appears well-developed and well-nourished. No distress.  Morbidly obese;   HENT:  Head: Normocephalic and atraumatic.  Right Ear: External ear normal.  Left Ear: External ear normal.  Eyes: Conjunctivae are normal. No scleral icterus.  Neck: No thyromegaly present.  Cardiovascular: Normal rate, regular rhythm and normal heart sounds.   Pulmonary/Chest: Effort normal and breath sounds normal. No respiratory distress. She has no wheezes.  Abdominal: Soft. She exhibits no distension. There is no tenderness. There is no rebound and no guarding.  Well healed trocar scars  Lymphadenopathy:    She has no cervical adenopathy.  Neurological: She is alert and oriented to person, place, and time.  Skin: Skin is warm and dry. No rash noted. She is not diaphoretic. No erythema. No pallor.  Psychiatric: She has a normal mood and affect. Her behavior is normal. Judgment and thought content normal.    Data Reviewed MY office note from 11/2011 UGI - small hiatal hernia mammo - normal preop lab screen - ok except for BS of 158. TG 157  Assessment    Morbid obesity 59.18 DM HTN OSA on CPAP DJD - Knees Elevated lipids     Plan    She is currently scheduled for laparoscopic adjustable gastric band placement and possible hiatal hernia repair next Tuesday. She was encouraged to continue with her preoperative diet. All of her questions were asked and answered. We will keep her overnight for observation. She was encouraged and reminded to bring her CPAP mask to the hospital. We discussed the  importance of getting some form of exercise after surgery as it will be integral and essential to her weight loss.   She does not have any symptoms of GERD however she does have a small hiatal hernia on upper GI. I explained that we will test for hiatal hernia during surgery and possibly repair it we found that it's a significant defect.  Mary Sella. Andrey Campanile, MD, FACS General, Bariatric, & Minimally Invasive Surgery [facility], [place]         [facility] M 03/25/2012, 4:58 PM

## 2012-03-30 ENCOUNTER — Encounter (HOSPITAL_COMMUNITY): Payer: Self-pay | Admitting: *Deleted

## 2012-03-30 ENCOUNTER — Encounter (HOSPITAL_COMMUNITY): Payer: Self-pay | Admitting: Anesthesiology

## 2012-03-30 ENCOUNTER — Ambulatory Visit (HOSPITAL_COMMUNITY)
Admission: RE | Admit: 2012-03-30 | Discharge: 2012-03-31 | Disposition: A | Discharge status: Home / Self Care | Payer: 59 | Source: Ambulatory Visit | Attending: General Surgery | Admitting: General Surgery

## 2012-03-30 ENCOUNTER — Encounter (HOSPITAL_COMMUNITY)
Admission: RE | Disposition: A | Discharge status: Home / Self Care | Payer: Self-pay | Source: Ambulatory Visit | Attending: General Surgery

## 2012-03-30 ENCOUNTER — Ambulatory Visit (HOSPITAL_COMMUNITY): Payer: 59 | Admitting: Anesthesiology

## 2012-03-30 DIAGNOSIS — E7889 Other lipoprotein metabolism disorders: Secondary | ICD-10-CM | POA: Insufficient documentation

## 2012-03-30 DIAGNOSIS — I10 Essential (primary) hypertension: Secondary | ICD-10-CM

## 2012-03-30 DIAGNOSIS — G4733 Obstructive sleep apnea (adult) (pediatric): Secondary | ICD-10-CM | POA: Insufficient documentation

## 2012-03-30 DIAGNOSIS — Z6841 Body Mass Index (BMI) 40.0 and over, adult: Secondary | ICD-10-CM

## 2012-03-30 DIAGNOSIS — K7689 Other specified diseases of liver: Secondary | ICD-10-CM | POA: Insufficient documentation

## 2012-03-30 DIAGNOSIS — E119 Type 2 diabetes mellitus without complications: Secondary | ICD-10-CM | POA: Diagnosis present

## 2012-03-30 DIAGNOSIS — Z01812 Encounter for preprocedural laboratory examination: Secondary | ICD-10-CM | POA: Insufficient documentation

## 2012-03-30 DIAGNOSIS — Z79899 Other long term (current) drug therapy: Secondary | ICD-10-CM | POA: Insufficient documentation

## 2012-03-30 DIAGNOSIS — M1710 Unilateral primary osteoarthritis, unspecified knee: Secondary | ICD-10-CM | POA: Insufficient documentation

## 2012-03-30 HISTORY — PX: MESH APPLIED TO LAP PORT: SHX5969

## 2012-03-30 HISTORY — PX: LAPAROSCOPIC GASTRIC BANDING: SHX1100

## 2012-03-30 LAB — GLUCOSE, CAPILLARY
Glucose-Capillary: 123 mg/dL — ABNORMAL HIGH (ref 70–99)
Glucose-Capillary: 126 mg/dL — ABNORMAL HIGH (ref 70–99)
Glucose-Capillary: 103 mg/dL — ABNORMAL HIGH (ref 70–99)

## 2012-03-30 SURGERY — GASTRIC BANDING, LAPAROSCOPIC
Anesthesia: General | Site: Abdomen | Wound class: Clean

## 2012-03-30 MED ORDER — ESCITALOPRAM OXALATE 20 MG PO TABS
20.0000 mg | ORAL_TABLET | Freq: Every day | ORAL | Status: DC
  Filled 2012-03-30: qty 1

## 2012-03-30 MED ORDER — ENOXAPARIN SODIUM 40 MG/0.4ML ~~LOC~~ SOLN
40.0000 mg | Freq: Two times a day (BID) | SUBCUTANEOUS | Status: DC
  Administered 2012-03-31: 40 mg via SUBCUTANEOUS
  Filled 2012-03-30 (×3): qty 0.4

## 2012-03-30 MED ORDER — PROMETHAZINE HCL 25 MG/ML IJ SOLN
INTRAMUSCULAR | Status: DC | PRN
  Administered 2012-03-30: 12.5 mg via INTRAVENOUS

## 2012-03-30 MED ORDER — SODIUM CHLORIDE 0.9 % IJ SOLN
INTRAMUSCULAR | Status: DC | PRN
  Administered 2012-03-30: 20 mL

## 2012-03-30 MED ORDER — HEPARIN SODIUM (PORCINE) 5000 UNIT/ML IJ SOLN
5000.0000 [IU] | INTRAMUSCULAR | Status: AC
  Administered 2012-03-30: 5000 [IU] via SUBCUTANEOUS
  Filled 2012-03-30: qty 1

## 2012-03-30 MED ORDER — LISINOPRIL-HYDROCHLOROTHIAZIDE 20-12.5 MG PO TABS: 1.0000 | ORAL_TABLET | Freq: Every day | ORAL | Status: DC

## 2012-03-30 MED ORDER — ONDANSETRON HCL 4 MG/2ML IJ SOLN: 4.0000 mg | INTRAMUSCULAR | Status: DC | PRN

## 2012-03-30 MED ORDER — ONDANSETRON HCL 4 MG/2ML IJ SOLN
INTRAMUSCULAR | Status: DC | PRN
  Administered 2012-03-30: 4 mg via INTRAVENOUS

## 2012-03-30 MED ORDER — AMLODIPINE BESYLATE 2.5 MG PO TABS
2.5000 mg | ORAL_TABLET | Freq: Every day | ORAL | Status: DC
  Filled 2012-03-30: qty 1

## 2012-03-30 MED ORDER — INSULIN ASPART 100 UNIT/ML ~~LOC~~ SOLN
0.0000 [IU] | SUBCUTANEOUS | Status: DC
  Administered 2012-03-30: 3 [IU] via SUBCUTANEOUS

## 2012-03-30 MED ORDER — ACETAMINOPHEN 10 MG/ML IV SOLN
1000.0000 mg | Freq: Four times a day (QID) | INTRAVENOUS | Status: DC
  Administered 2012-03-30 – 2012-03-31 (×3): 1000 mg via INTRAVENOUS
  Filled 2012-03-30 (×5): qty 100

## 2012-03-30 MED ORDER — LISINOPRIL 20 MG PO TABS
20.0000 mg | ORAL_TABLET | Freq: Every day | ORAL | Status: DC
  Filled 2012-03-30: qty 1

## 2012-03-30 MED ORDER — GLYCOPYRROLATE 0.2 MG/ML IJ SOLN
INTRAMUSCULAR | Status: DC | PRN
  Administered 2012-03-30: .7 mg via INTRAVENOUS

## 2012-03-30 MED ORDER — MIDAZOLAM HCL 5 MG/5ML IJ SOLN
INTRAMUSCULAR | Status: DC | PRN
  Administered 2012-03-30: 2 mg via INTRAVENOUS

## 2012-03-30 MED ORDER — ONDANSETRON HCL 4 MG/2ML IJ SOLN
INTRAMUSCULAR | Status: AC
  Filled 2012-03-30: qty 2

## 2012-03-30 MED ORDER — ACETAMINOPHEN 160 MG/5ML PO SOLN: 650.0000 mg | ORAL | Status: DC | PRN

## 2012-03-30 MED ORDER — LACTATED RINGERS IV SOLN
INTRAVENOUS | Status: DC | PRN
  Administered 2012-03-30: 10:00:00 via INTRAVENOUS

## 2012-03-30 MED ORDER — LACTATED RINGERS IV SOLN: INTRAVENOUS | Status: DC

## 2012-03-30 MED ORDER — HYDROCHLOROTHIAZIDE 12.5 MG PO CAPS
12.5000 mg | ORAL_CAPSULE | Freq: Every day | ORAL | Status: DC
  Filled 2012-03-30: qty 1

## 2012-03-30 MED ORDER — PROMETHAZINE HCL 25 MG/ML IJ SOLN: 12.5000 mg | Freq: Four times a day (QID) | INTRAMUSCULAR | Status: DC | PRN

## 2012-03-30 MED ORDER — DEXTROSE 5 % IV SOLN
2.0000 g | INTRAVENOUS | Status: AC
  Administered 2012-03-30: 2 g via INTRAVENOUS
  Filled 2012-03-30: qty 2

## 2012-03-30 MED ORDER — UNJURY CHOCOLATE CLASSIC POWDER: 2.0000 [oz_av] | Freq: Four times a day (QID) | ORAL | Status: DC

## 2012-03-30 MED ORDER — PROMETHAZINE HCL 25 MG/ML IJ SOLN: 6.2500 mg | INTRAMUSCULAR | Status: DC | PRN

## 2012-03-30 MED ORDER — MORPHINE SULFATE 2 MG/ML IJ SOLN
INTRAMUSCULAR | Status: AC
  Filled 2012-03-30: qty 1

## 2012-03-30 MED ORDER — POTASSIUM CHLORIDE IN NACL 20-0.9 MEQ/L-% IV SOLN
INTRAVENOUS | Status: DC
  Administered 2012-03-30 – 2012-03-31 (×2): via INTRAVENOUS
  Filled 2012-03-30 (×4): qty 1000

## 2012-03-30 MED ORDER — FENTANYL CITRATE 0.05 MG/ML IJ SOLN
INTRAMUSCULAR | Status: DC | PRN
  Administered 2012-03-30 (×2): 50 ug via INTRAVENOUS
  Administered 2012-03-30: 100 ug via INTRAVENOUS
  Administered 2012-03-30: 50 ug via INTRAVENOUS

## 2012-03-30 MED ORDER — SUCCINYLCHOLINE CHLORIDE 20 MG/ML IJ SOLN
INTRAMUSCULAR | Status: DC | PRN
  Administered 2012-03-30: 100 mg via INTRAVENOUS

## 2012-03-30 MED ORDER — UNJURY CHICKEN SOUP POWDER: 2.0000 [oz_av] | Freq: Four times a day (QID) | ORAL | Status: DC

## 2012-03-30 MED ORDER — ROCURONIUM BROMIDE 100 MG/10ML IV SOLN
INTRAVENOUS | Status: DC | PRN
  Administered 2012-03-30: 20 mg via INTRAVENOUS
  Administered 2012-03-30: 50 mg via INTRAVENOUS
  Administered 2012-03-30: 10 mg via INTRAVENOUS

## 2012-03-30 MED ORDER — CLONAZEPAM 0.5 MG PO TABS: 0.2500 mg | ORAL_TABLET | Freq: Two times a day (BID) | ORAL | Status: DC | PRN

## 2012-03-30 MED ORDER — LACTATED RINGERS IV SOLN
INTRAVENOUS | Status: DC
  Administered 2012-03-30: 1000 mL via INTRAVENOUS

## 2012-03-30 MED ORDER — UNJURY VANILLA POWDER: 2.0000 [oz_av] | Freq: Four times a day (QID) | ORAL | Status: DC

## 2012-03-30 MED ORDER — NEOSTIGMINE METHYLSULFATE 1 MG/ML IJ SOLN
INTRAMUSCULAR | Status: DC | PRN
  Administered 2012-03-30: 4 mg via INTRAVENOUS

## 2012-03-30 MED ORDER — BUPIVACAINE HCL 0.25 % IJ SOLN
INTRAMUSCULAR | Status: DC | PRN
  Administered 2012-03-30: 30 mL

## 2012-03-30 MED ORDER — MORPHINE SULFATE 2 MG/ML IJ SOLN
2.0000 mg | INTRAMUSCULAR | Status: DC | PRN
  Administered 2012-03-30 (×3): 2 mg via INTRAVENOUS
  Filled 2012-03-30 (×2): qty 1

## 2012-03-30 MED ORDER — FENTANYL CITRATE 0.05 MG/ML IJ SOLN: 25.0000 ug | INTRAMUSCULAR | Status: DC | PRN

## 2012-03-30 SURGICAL SUPPLY — 96 items
ACC PORT GSTRC BAND STD KT HI (Band) ×2 IMPLANT
ADH SKN CLS APL DERMABOND .7 (GAUZE/BANDAGES/DRESSINGS)
APL SKNCLS STERI-STRIP NONHPOA (GAUZE/BANDAGES/DRESSINGS) ×2
APPLIER CLIP ROT 10 11.4 M/L (STAPLE)
APR CLP MED LRG 11.4X10 (STAPLE)
BAND LAP VG SYSTEM W/TUBES (Band) ×1 IMPLANT
BENZOIN TINCTURE PRP APPL 2/3 (GAUZE/BANDAGES/DRESSINGS) ×3 IMPLANT
BLADE HEX COATED 2.75 (ELECTRODE) ×3 IMPLANT
BLADE SURG 15 STRL LF DISP TIS (BLADE) ×2 IMPLANT
BLADE SURG 15 STRL SS (BLADE) ×3
BLADE SURG SZ11 CARB STEEL (BLADE) ×3 IMPLANT
CABLE HIGH FREQUENCY MONO STRZ (ELECTRODE) ×2 IMPLANT
CANISTER SUCTION 2500CC (MISCELLANEOUS) ×3 IMPLANT
CHLORAPREP W/TINT 26ML (MISCELLANEOUS) ×5 IMPLANT
CLAMP ENDO BABCK 10MM (STAPLE) IMPLANT
CLIP APPLIE ROT 10 11.4 M/L (STAPLE) IMPLANT
CLOTH BEACON ORANGE TIMEOUT ST (SAFETY) ×3 IMPLANT
COVER SURGICAL LIGHT HANDLE (MISCELLANEOUS) ×3 IMPLANT
DECANTER SPIKE VIAL GLASS SM (MISCELLANEOUS) ×3 IMPLANT
DERMABOND ADVANCED (GAUZE/BANDAGES/DRESSINGS)
DERMABOND ADVANCED .7 DNX12 (GAUZE/BANDAGES/DRESSINGS) IMPLANT
DEVICE SUT QUICK LOAD TK 5 (STAPLE) ×15 IMPLANT
DEVICE SUT TI-KNOT TK 5X26 (MISCELLANEOUS) ×3 IMPLANT
DEVICE SUTURE ENDOST 10MM (ENDOMECHANICALS) ×1 IMPLANT
DISSECTOR BLUNT TIP ENDO 5MM (MISCELLANEOUS) ×5 IMPLANT
DRAIN PENROSE 18X1/2 LTX STRL (DRAIN) ×1 IMPLANT
DRAPE CAMERA CLOSED 9X96 (DRAPES) ×3 IMPLANT
DRAPE LAPAROSCOPIC ABDOMINAL (DRAPES) ×3 IMPLANT
DRAPE UTILITY XL STRL (DRAPES) ×7 IMPLANT
ELECT REM PT RETURN 9FT ADLT (ELECTROSURGICAL) ×3
ELECTRODE REM PT RTRN 9FT ADLT (ELECTROSURGICAL) ×2 IMPLANT
FILTER SMOKE EVAC LAPAROSHD (FILTER) IMPLANT
GLOVE BIO SURGEON STRL SZ7.5 (GLOVE) ×3 IMPLANT
GLOVE BIOGEL M STRL SZ7.5 (GLOVE) ×2 IMPLANT
GLOVE BIOGEL PI IND STRL 7.0 (GLOVE) ×2 IMPLANT
GLOVE BIOGEL PI INDICATOR 7.0 (GLOVE) ×2
GLOVE INDICATOR 8.0 STRL GRN (GLOVE) ×1 IMPLANT
GOWN STRL NON-REIN LRG LVL3 (GOWN DISPOSABLE) ×3 IMPLANT
GOWN STRL REIN XL XLG (GOWN DISPOSABLE) ×8 IMPLANT
GRASPER ENDO BABCOCK 10 (MISCELLANEOUS) IMPLANT
GRASPER ENDO BABCOCK 10MM (MISCELLANEOUS)
HAND ACTIVATED (MISCELLANEOUS) ×1 IMPLANT
HOVERMATT SINGLE USE (MISCELLANEOUS) ×3 IMPLANT
KIT ACCESS PORT VG (Band) ×2 IMPLANT
KIT BASIN OR (CUSTOM PROCEDURE TRAY) ×3 IMPLANT
LAP BAND VG SYSTEM W/TUBES (Band) ×3 IMPLANT
MESH HERNIA 1X4 RECT BARD (Mesh General) ×1 IMPLANT
MESH HERNIA BARD 1X4 (Mesh General) ×1 IMPLANT
NDL SPNL 22GX3.5 QUINCKE BK (NEEDLE) ×1 IMPLANT
NEEDLE SPNL 22GX3.5 QUINCKE BK (NEEDLE) ×3 IMPLANT
NS IRRIG 1000ML POUR BTL (IV SOLUTION) ×3 IMPLANT
PACK UNIVERSAL I (CUSTOM PROCEDURE TRAY) ×3 IMPLANT
PENCIL BUTTON HOLSTER BLD 10FT (ELECTRODE) ×3 IMPLANT
SCALPEL HARMONIC ACE (MISCELLANEOUS) IMPLANT
SCISSORS LAP 5X35 DISP (ENDOMECHANICALS) ×1 IMPLANT
SET IRRIG TUBING LAPAROSCOPIC (IRRIGATION / IRRIGATOR) ×3 IMPLANT
SHEARS CURVED HARMONIC AC 45CM (MISCELLANEOUS) ×2 IMPLANT
SLEEVE ADV FIXATION 5X100MM (TROCAR) IMPLANT
SLEEVE Z-THREAD 5X100MM (TROCAR) IMPLANT
SOLUTION ANTI FOG 6CC (MISCELLANEOUS) ×3 IMPLANT
SPONGE LAP 18X18 X RAY DECT (DISPOSABLE) ×3 IMPLANT
STAPLER VISISTAT 35W (STAPLE) ×3 IMPLANT
STRIP CLOSURE SKIN 1/2X4 (GAUZE/BANDAGES/DRESSINGS) IMPLANT
SUT ETHIBOND 2 0 SH (SUTURE) ×9
SUT ETHIBOND 2 0 SH 36X2 (SUTURE) ×6 IMPLANT
SUT MNCRL AB 4-0 PS2 18 (SUTURE) ×3 IMPLANT
SUT PROLENE 2 0 CT2 30 (SUTURE) ×3 IMPLANT
SUT SILK 0 (SUTURE) ×3
SUT SILK 0 30XBRD TIE 6 (SUTURE) ×2 IMPLANT
SUT SURGIDAC NAB ES-9 0 48 120 (SUTURE) ×8 IMPLANT
SUT VIC AB 2-0 SH 27 (SUTURE) ×3
SUT VIC AB 2-0 SH 27X BRD (SUTURE) ×2 IMPLANT
SUT VIC AB 4-0 SH 18 (SUTURE) ×3 IMPLANT
SYR 20CC LL (SYRINGE) ×3 IMPLANT
SYR 30ML LL (SYRINGE) ×3 IMPLANT
SYR CONTROL 10ML LL (SYRINGE) ×1 IMPLANT
SYS KII OPTICAL ACCESS 15MM (TROCAR) ×3
SYSTEM KII OPTICAL ACCESS 15MM (TROCAR) ×2 IMPLANT
TIP INNERVISION DETACH 40FR (MISCELLANEOUS) IMPLANT
TIP INNERVISION DETACH 50FR (MISCELLANEOUS) IMPLANT
TIP INNERVISION DETACH 56FR (MISCELLANEOUS) IMPLANT
TIPS INNERVISION DETACH 40FR (MISCELLANEOUS)
TOWEL OR 17X26 10 PK STRL BLUE (TOWEL DISPOSABLE) ×3 IMPLANT
TRAY FOLEY CATH 14FRSI W/METER (CATHETERS) ×3 IMPLANT
TRAY LAP CHOLE (CUSTOM PROCEDURE TRAY) ×1 IMPLANT
TROCAR ADV FIXATION 11X100MM (TROCAR) IMPLANT
TROCAR ADV FIXATION 5X100MM (TROCAR) ×2 IMPLANT
TROCAR BLADELESS OPT 5 100 (ENDOMECHANICALS) ×5 IMPLANT
TROCAR XCEL BLUNT TIP 100MML (ENDOMECHANICALS) IMPLANT
TROCAR XCEL NON-BLD 11X100MML (ENDOMECHANICALS) IMPLANT
TROCAR Z-THREAD FIOS 11X100 BL (TROCAR) ×1 IMPLANT
TROCAR Z-THREAD FIOS 5X100MM (TROCAR) ×6 IMPLANT
TROCAR Z-THREAD SLEEVE 11X100 (TROCAR) IMPLANT
TUBE CALIBRATION LAPBAND (TUBING) ×3 IMPLANT
TUBING FILTER THERMOFLATOR (ELECTROSURGICAL) ×3 IMPLANT
TUBING INSUFFLATION 10FT LAP (TUBING) ×3 IMPLANT

## 2012-03-30 NOTE — Op Note (Signed)
Laparoscopic Adjustable Gastric Band Placement Operative Note   Pre-operative Diagnosis: Morbid Obesity (BMI 59.1) DM (non-insulin dependent) HTN  OSA on CPAP  DJD - Knees  Elevated lipids    Post-operative Diagnosis: same  Surgeon: Atilano Ina   Assistants: Jaclynn Guarneri MD  Anesthesia: General endotracheal anesthesia and Local anesthesia 0.25.% bupivacaine, with epinephrine  ASA Class: 3   Anesthesia: General plus local  Indications: Morbid Obesity unresponsive to medical treatment.  Findings: AP - Large band; no significant hiatal hernia identified on dissection. Large Fatty liver   Procedure Details  The patient was seen in the Holding Room. The risks, benefits, complications, treatment options, and expected outcomes were discussed with the patient. The possibilities of reaction to medication, pulmonary aspiration, perforation of viscus, bleeding, recurrent infection, the need for additional procedures, failure to diagnose a condition, and creating a complication requiring transfusion or operation were discussed with the patient. The patient concurred with the proposed plan, giving informed consent.   The patient was taken to Operating Room # 1 at [facility], identified as Kim Brock and the procedure verified as Laparoscopic Adjustable Gastric Band Placement and possible hiatal hernia repair. A Time Out was held and the above information confirmed.  Full general anesthesia was induced with orotracheal intubation.  The patient was prepped and draped in a supine position. Appropriate antibiotics were given intravenously.  A 1cm incision was made 2 fingerbreadths below the left subcostal margin . Opitview technique was used to gain entry to the abdominal cavity. A 5 mm blunt trocar was advanced under direct vision through the abdominal wall with a 0 degree scope. The abdomen was insufflated, the laparoscope introduced.  There were no untoward findings on  diagnostic laparoscopy. Trocars were placed under direct vision in the following fashion: a 5mm trocar in the lateral right upper quadrant, a 15mm trocar in the right upper quadrant, a 5mm trocar in the high epigastrium, and one 5mm trocar slightly above and to the left of the umbilicus.  An additional 5 mm trocar was added in the left lateral upper quadrant to help with retraction and exposure given the large amount of intra-abdominal fat. The patient was placed in reverse trendelenburg position.  [facility] liver retractor was then placed through the high epigastric trocar site and positioned to hold the liver.   The angle of His was identified and the left crus was dissected free.  A calibration tube was placed into the stomach via the oropharynx by the CRNA. 10cc of air was used to inflate the balloon on the tube. The calibration tube was then pulled back and it met a little resistance at the GE junction. About 1/2 of the balloon slid up past the GE junction, the other half remained in the abdominal cavity. The tube was deflated and pulled back into the esophagus. The gastrohepatic ligament was incised. We idenitifed the crossing fat at level of the right crus and then started to dissect and look for a hiatal hernia. The left crus was identified. There was a large amount of fat in this part of the abdomen. We could not identify a large defect b/t the crus. We had the CRNA advance the calibration tube back into the stomach and inflate the balloon with 15cc of air and then pull back toward the GE junction. The balloon stopped at the GE junction and did not slide above the GE junction. The tube was deflated and pulled back into the esophagus. Since we did not appreciate  a significant defect on dissection, had adequate resistance with 15cc balloon test and the pt doesn't have any symptoms of reflux, I did not do any further dissection.   Approximately 8cm below the angle of His on the lesser curvature,  passing through the pars flaccida and preserving the vagus nerve, a blunt instrument was gently passed anterior to the right crus and behind the gastro-esophageal junction without difficulty. Care was taken to minimize posterior dissection in order to prevent a posterior slip.   A AP-Large Lap Band was introduced into the abdominal cavity through the 15mm trocar and carried around the gastro-esophageal junction and locked onto itself. Three interrupted 2.0 Ethibond sutures (each secured with a titanium tie knot) were used to imbricate the anterior stomach to itself over the band to prevent anterior slippage.   The bowel was examined and there were no obvious lesions. Hemostasis was verified. The liver retractor was removed under direct visualization. There was no evidence of liver injury. The tubing from the band was brought out via the right upper quadrant 15mm trocar site. All trocars were then removed under direct vision. The skin incision was lengthened and a subcutaneous space was made to accommodate the port. A 1 inch square of vicryl mesh was anchored to the base of the port with 4 sutures. A new port was obtained after I noticed a small tear in my glove. The port tubing was bathed in betadine.  The new port was attached to the tubing and then placed in the subcutaneous pocket.  The redundant tubing was advanced back into the abdominal cavity. Inverted interrupted deep dermal sutures using a 2-0 vicryl were placed.   The wounds were heavily irrigated. The skin incisions were closed with 4-0 monocryl. Dermabond was applied.   Instrument, sponge, and needle counts were correct prior to wound closure and at the conclusion of the case.          Complications:  None; patient tolerated the procedure well.                Condition: stable  Mary Sella. Andrey Campanile, MD, FACS General, Bariatric, & Minimally Invasive Surgery [facility], [place]

## 2012-03-30 NOTE — H&P (View-Only) (Signed)
Patient ID: Kim Brock, female   DOB: 05/15/1960, 52 y.o.   MRN: [id number]  Chief Complaint  Patient presents with  . Bariatric Pre-op    lap band scheduled 03/30/2012    HPI Kim Brock is a 52 y.o. female.   HPI 52-year-old morbidly obese Caucasian female comes in today for her preoperative appointment. She is currently scheduled to undergo laparoscopic adjustable gastric band placement on February 25. She was last seen in the office at the end of October 2013. She denies any changes to her past medical history. She states only thing that has been an issue of late has been her blood sugars have been running in the 140s to low 160s. She is attributing it to stress at work.  She states that Dr. Turner wants to manage her CPAP postoperatively as she loses weight.  She states that she started her preoperative diet. She is still very limited on the amount of exercise that she can do because of her severe bilateral knee pain Past Medical History  Diagnosis Date  . Arthritis   . Hypertension   . Morbid obesity with BMI of 50.0-59.9, adult     BMI 59.3  . OSA on CPAP     uses cpap setting of 11  . Diabetes mellitus without complication     diet controlled  . PONV (postoperative nausea and vomiting)     nausea likes zofran, pt has motion sickness    Past Surgical History  Procedure Laterality Date  . Knee surgery  2009/2010    left and right knee  . Dilation and curettage of uterus  2011  . Cholecystectomy  1999  . Tubal ligation  2001    Family History  Problem Relation Age of Onset  . Diabetes Mother   . Hypertension Mother   . Osteoarthritis Mother   . Hyperlipidemia Father   . Cancer Paternal Grandmother     Colon    Social History History  Substance Use Topics  . Smoking status: Former Smoker -- 1.00 packs/day for 11 years    Types: Cigarettes    Quit date: 08/06/1991  . Smokeless tobacco: Never Used  . Alcohol Use: 0.0 oz/week    1-2 Glasses of wine  per week     Comment: 1-2 drinks     Allergies  Allergen Reactions  . Demerol (Meperidine) Other (See Comments)    Blood pressure drops and makes me violently nauseous.  . Epinephrine Other (See Comments)    In Novocaine - makes jittery and heart race  . Percocet (Oxycodone-Acetaminophen)     Family h/o of poor tolerance.  Will not take - can tolerate Vicodin  . Tape Other (See Comments)    Steri strips - blisters    Current Outpatient Prescriptions  Medication Sig Dispense Refill  . albuterol (PROVENTIL HFA;VENTOLIN HFA) 108 (90 BASE) MCG/ACT inhaler Inhale 2 puffs into the lungs every 4 (four) hours as needed for shortness of breath.       . amLODipine (NORVASC) 2.5 MG tablet Take 2.5 mg by mouth every morning.       . clonazePAM (KLONOPIN) 0.5 MG tablet Take 0.25 mg by mouth 3 (three) times a week.       . diclofenac (VOLTAREN) 75 MG EC tablet Take 75 mg by mouth 2 (two) times daily.       . escitalopram (LEXAPRO) 20 MG tablet Take 20 mg by mouth daily.      . lisinopril-hydrochlorothiazide (PRINZIDE,ZESTORETIC) 20-12.5   MG per tablet Take 1 tablet by mouth daily.       . Multiple Vitamin (MULTIVITAMIN WITH MINERALS) TABS Take 1 tablet by mouth daily. Pt has not started yet       No current facility-administered medications for this visit.    Review of Systems Review of Systems  Constitutional: Negative for fever, appetite change, fatigue and unexpected weight change.  HENT: Negative for hearing loss, nosebleeds and neck pain.   Respiratory:       +OSA on CPAP, managed Dr Tracie Turner  Cardiovascular: Negative for chest pain, palpitations and leg swelling.       Denies CP, SOB, orthopnea, PND  Gastrointestinal: Negative for abdominal pain, diarrhea, constipation and abdominal distention.  Genitourinary: Negative for dysuria and difficulty urinating.  Musculoskeletal:       B/l knee pain  Neurological: Negative for dizziness, tremors, seizures, facial asymmetry, weakness  and numbness.  Psychiatric/Behavioral: Negative for behavioral problems and agitation. The patient is not hyperactive.        Lots of stress at work lately    Blood pressure 130/72, pulse 84, resp. rate 20, height 5' 5.5" (1.664 m), weight 361 lb 2 oz (163.805 kg), last menstrual period 02/04/2011.  Physical Exam Physical Exam  Vitals reviewed. Constitutional: She is oriented to person, place, and time. She appears well-developed and well-nourished. No distress.  Morbidly obese;   HENT:  Head: Normocephalic and atraumatic.  Right Ear: External ear normal.  Left Ear: External ear normal.  Eyes: Conjunctivae are normal. No scleral icterus.  Neck: No thyromegaly present.  Cardiovascular: Normal rate, regular rhythm and normal heart sounds.   Pulmonary/Chest: Effort normal and breath sounds normal. No respiratory distress. She has no wheezes.  Abdominal: Soft. She exhibits no distension. There is no tenderness. There is no rebound and no guarding.  Well healed trocar scars  Lymphadenopathy:    She has no cervical adenopathy.  Neurological: She is alert and oriented to person, place, and time.  Skin: Skin is warm and dry. No rash noted. She is not diaphoretic. No erythema. No pallor.  Psychiatric: She has a normal mood and affect. Her behavior is normal. Judgment and thought content normal.    Data Reviewed MY office note from 11/2011 [facility] - small hiatal hernia mammo - normal preop lab screen - ok except for BS of 158. TG 157  Assessment    Morbid obesity 59.18 DM HTN OSA on CPAP DJD - Knees Elevated lipids     Plan    She is currently scheduled for laparoscopic adjustable gastric band placement and possible hiatal hernia repair next Tuesday. She was encouraged to continue with her preoperative diet. All of her questions were asked and answered. We will keep her overnight for observation. She was encouraged and reminded to bring her CPAP mask to the hospital. We discussed the  importance of getting some form of exercise after surgery as it will be integral and essential to her weight loss.   She does not have any symptoms of GERD however she does have a small hiatal hernia on upper GI. I explained that we will test for hiatal hernia during surgery and possibly repair it we found that it's a significant defect.  Jerelyn Trimarco M. Milania Haubner, MD, FACS General, Bariatric, & Minimally Invasive Surgery [facility]         Miliana Gangwer M 03/25/2012, 4:58 PM    

## 2012-03-30 NOTE — Transfer of Care (Signed)
Immediate Anesthesia Transfer of Care Note  Patient: Kim Brock  Procedure(s) Performed: Procedure(s) (LRB): LAPAROSCOPIC GASTRIC BANDING (N/A) MESH APPLIED TO LAP PORT (N/A)  Patient Location: PACU  Anesthesia Type: General  Level of Consciousness: sedated, patient cooperative and responds to stimulaton  Airway & Oxygen Therapy: Patient Spontanous Breathing and Patient connected to face mask oxgen  Post-op Assessment: Report given to PACU RN and Post -op Vital signs reviewed and stable  Post vital signs: Reviewed and stable  Complications: No apparent anesthesia complications

## 2012-03-30 NOTE — Anesthesia Preprocedure Evaluation (Addendum)
Anesthesia Evaluation  Patient identified by MRN, date of birth, ID band Patient awake    Reviewed: Allergy & Precautions, H&P , NPO status , Patient's Chart, lab work & pertinent test results  History of Anesthesia Complications (+) PONV  Airway Mallampati: II TM Distance: >3 FB Neck ROM: Full    Dental no notable dental hx.    Pulmonary neg pulmonary ROS, sleep apnea and Continuous Positive Airway Pressure Ventilation ,  breath sounds clear to auscultation  Pulmonary exam normal       Cardiovascular hypertension, Pt. on medications negative cardio ROS  Rhythm:Regular Rate:Normal     Neuro/Psych negative neurological ROS  negative psych ROS   GI/Hepatic negative GI ROS, Neg liver ROS,   Endo/Other  negative endocrine ROSdiabetesMorbid obesity  Renal/GU negative Renal ROS  negative genitourinary   Musculoskeletal negative musculoskeletal ROS (+)   Abdominal   Peds negative pediatric ROS (+)  Hematology negative hematology ROS (+)   Anesthesia Other Findings   Reproductive/Obstetrics negative OB ROS                           Anesthesia Physical Anesthesia Plan  ASA: III  Anesthesia Plan: General   Post-op Pain Management:    Induction: Intravenous  Airway Management Planned: Oral ETT  Additional Equipment:   Intra-op Plan:   Post-operative Plan: Extubation in OR  Informed Consent: I have reviewed the patients History and Physical, chart, labs and discussed the procedure including the risks, benefits and alternatives for the proposed anesthesia with the patient or authorized representative who has indicated his/her understanding and acceptance.   Dental advisory given  Plan Discussed with: CRNA  Anesthesia Plan Comments: (No tylenol, elevated lfts)       Anesthesia Quick Evaluation

## 2012-03-30 NOTE — Progress Notes (Signed)
Rt placed pt on CPAP with 2LPM bleed in o2.and 11cm H2O. Pt had no adverse reaction to therapy.

## 2012-03-30 NOTE — Interval H&P Note (Signed)
History and Physical Interval Note:  03/30/2012 9:08 AM  Kim Brock  has presented today for surgery, with the diagnosis of morbid obesity  The various methods of treatment have been discussed with the patient and family. After consideration of risks, benefits and other options for treatment, the patient has consented to  Procedure(s) with comments: LAPAROSCOPIC GASTRIC BANDING (N/A) - Laparoscopic Adjustable Gastric Band Placement, possible Hiatal Hernia Repair LAPAROSCOPIC REPAIR OF HIATAL HERNIA (N/A) as a surgical intervention .  The patient's history has been reviewed, patient examined, no change in status, stable for surgery.  I have reviewed the patient's chart and labs.  Questions were answered to the patient's satisfaction.    Mary Sella. Andrey Campanile, MD, FACS General, Bariatric, & Minimally Invasive Surgery [facility], [place]  [facility] M

## 2012-03-30 NOTE — Anesthesia Postprocedure Evaluation (Signed)
  Anesthesia Post-op Note  Patient: Kim Brock  Procedure(s) Performed: Procedure(s) (LRB): LAPAROSCOPIC GASTRIC BANDING (N/A) MESH APPLIED TO LAP PORT (N/A)  Patient Location: PACU  Anesthesia Type: General  Level of Consciousness: awake and alert   Airway and Oxygen Therapy: Patient Spontanous Breathing  Post-op Pain: mild  Post-op Assessment: Post-op Vital signs reviewed, Patient's Cardiovascular Status Stable, Respiratory Function Stable, Patent Airway and No signs of Nausea or vomiting  Last Vitals:  Filed Vitals:   03/30/12 1307  BP: 179/89  Pulse: 95  Temp: 37 C  Resp: 28    Post-op Vital Signs: stable   Complications: No apparent anesthesia complications

## 2012-03-31 ENCOUNTER — Ambulatory Visit (HOSPITAL_COMMUNITY): Payer: 59

## 2012-03-31 MED ORDER — HYDROCODONE-ACETAMINOPHEN 5-325 MG PO TABS: 1.0000 | ORAL_TABLET | ORAL | Status: DC | PRN

## 2012-03-31 NOTE — Progress Notes (Signed)
Patient is alert and oriented, up sitting in recliner chair. VSS. Patient has minimal abdominal discomfort that is relieved with prn medication.  Patient has rash to abdomen and trunk area, Dr. Andrey Campanile aware.   Patient denies passing gas or bowel movement.  Patient is tolerating water without nausea or vomiting.  Patient is ambulating in hallway, using incentive spirometry, and wearing compression hose while in bed.  Discharge instructions listed below reviewed with patient.  Patient verbalized understanding.  Patient has follow up appointment with [facility].  Appointment made with [facility].    Quenton Fetter, RN  ADJUSTABLE GASTRIC BAND DISCHARGE INSTRUCTIONS  Drs. Fredrik Rigger, [place], [place], and [place] Call if you have any problems.   Call [phone] and ask for the surgeon on call.    If you need immediate assistance come to the ER at [facility]. Tell the ER personnel that you are a new post-op gastric banding patient. Signs and symptoms to report:   Severe vomiting or nausea. If you cannot tolerate clear liquids for longer than 1 day, you need to call your surgeon.    Abdominal pain which does not get better after taking your pain medication   Fever greater than 101 F degree   Difficulty breathing   Chest pain    Redness, swelling, drainage, or foul odor at incision sites    If your incisions open or pull apart   Swelling or pain in calf (lower leg)   Diarrhea, frequent watery, uncontrolled bowel movements.   Constipation, (no bowel movements for 3 days) if this occurs, Take Milk of Magnesia, 2 tablespoons by mouth, 3 times a day for 2 days if needed.  Call your doctor if constipation continues. Stop taking Milk of Magnesia once you have had a bowel movement. You may also use Miralax according to the label instructions.   Anything you consider "abnormal for you".   Normal side effects after Surgery:   Unable to sleep at night or concentrate   Irritability   Being tearful (crying) or  depressed   These are common complaints, possibly related to your anesthesia, stress of surgery and change in lifestyle, that usually go away a few weeks after surgery.  If these feelings continue, call your medical doctor.  Wound Care You may have surgical glue, steri-strips, or staples over your incisions after surgery.  Surgical glue:  Looks like a clear film over your incisions and will wear off gradually. Steri-strips: Strips of tape over your incisions. You may notice a yellowish color on the skin underneath the steri-strips. This is a substance used to make the steri-strips stick better. Do not pull the steri-strips off - let them fall off. Staples: Cherlynn Polo may be removed before you leave the hospital. If you go home with staples, call [facility] [phone]) for an appointment with your surgeon's nurse to have staples removed in 7 - 10 days. Showering: You may shower two days after your surgery unless otherwise instructed by your surgeon. Wash gently around wounds with warm soapy water, rinse well, and gently pat dry.  If you have a drain, you may need someone to hold this while you shower. Avoid tub baths until staples are removed and incisions are healed.    Medications   Medications should be liquid or crushed if larger than the size of a dime.  Extended release pills should not be crushed.   Depending on the size and number of medications you take, you may need to stagger/change the time you  take your medications so that you do not over-fill your pouch.    Make sure you follow-up with your primary care physician to make medication adjustments needed during rapid weight loss and life-style adjustment.   If you are diabetic, follow up with the doctor that prescribes your diabetes medication(s) within one week after surgery and check your blood sugar regularly.   Do not drive while taking narcotics!   Do not take acetaminophen (Tylenol) and Roxicet or Lortab Elixir at the  same time since these pain medications contain acetaminophen.  Diet at home: (First 2 Weeks)  You will see the nutritionist two weeks after your surgery. She will advance your diet if you are tolerating liquids well. Once at home, if you have severe vomiting or nausea and cannot tolerate clear liquids lasting longer than 1 day, call your surgeon.  For Same Day Surgery Discharge Patients: The day of surgery drink water only: 2 ounces every 4 hours. If you are tolerating water, begin drinking your high protein shake the next morning. For Overnight Stay Patients: Begin high protein shake 2 ounces every 3 hours, 5 - 6 times per day.  Gradually increase the amount you drink as tolerated.  You may find it easier to slowly sip shakes throughout the day.  It is important to get your proteins in first.   Protein Shake   Drink at least 2 ounces of shake 5-6 times per day   Each serving of protein shakes should have a minimum of 15 grams of protein and no more than 5 grams of carbohydrate    Increase the amount of protein shake you drink as tolerated   Protein powder may be added to fluids such as non-fat milk or Lactaid milk (limit to 20 grams added protein powder per serving   The initial goal is to drink at least 8 ounces of protein shake/drink per day (or as directed by the nutritionist). Some examples of protein shakes are [employer], Dillard's, EAS Edge HP, and Unjury. Hydration   Gradually increase the amount of water and other liquids as tolerated (See Acceptable Fluids)   Gradually increase the amount of protein shake as tolerated     Sip fluids slowly and throughout the day   May use Sugar substitutes, use sparingly (limit to 6 - 8 packets per day).  Your fluid goal is 64 ounces of fluid daily. It may take a few weeks to build up to this.         32 oz (or more) should be clear liquids and 32 oz (or more) should be full liquids.         Liquids should not contain sugar, caffeine, or  carbonation!  Acceptable Fluids Clear Liquids:   Water or Sugar-free flavored water, Fruit H2O   Decaffeinated coffee or tea (sugar-free)   Crystal Lite, Wyler's Lite, Minute Maid Lite   Sugar-free Jell-O   Bouillon or broth   Sugar-free Popsicle:   *Less than 20 calories each; Limit 1 per day   Full Liquids:              Protein Shakes/Drinks + 2 choices per day of other full liquids shown below.    Other full liquids must be: No more than 12 grams of Carbs per serving,  No more than 3 grams of Fat per serving   Strained low-fat cream soup   Non-Fat milk   Fat-free Lactaid Milk   Sugar-free yogurt (Dannon Lite & Fit) Vitamins and  Minerals (Start 1 day after surgery unless otherwise directed)   1 Chewable Multivitamin / Multimineral Supplement (i.e. Centrum for Adults)   Chewable Calcium Citrate with Vitamin D-3. Take 1500 mg each day.           (Example: 3 Chewable Calcium Plus 600 with Vitamin D-3 can be found at [facility])           Do not mix multivitamins containing iron with calcium supplements; take 2 hours   apart   Do not substitute Tums (calcium carbonate) for your calcium   Menstruating women and those at risk for anemia may need extra iron. Talk with your doctor to see if you need additional iron.     If you need extra iron:  Total daily Iron recommendations (including Vitamins) = 50 - 100 mg Iron/day Do not stop taking or change any vitamins or minerals until you talk to your nutritionist or surgeon. Your nutritionist and / or physician must approve all vitamin and mineral supplements. Exercise For maximum success, begin exercising as soon as your doctor recommends. Make sure your physician approves any physical activity.   Depending on fitness level, begin with a simple walking program   Walk 5-15 minutes each day, 7 days per week.    Slowly increase until you are walking 30-45 minutes per day   Consider joining our BELT program. [phone] or email  belt@[employer] .edu Things to remember:   You may have sexual relations when you feel comfortable. It is VERY important for female patients to use a reliable birth control method. Fertility often increases after surgery. Do not get pregnant for at least 18 months.   It is very important to keep all follow up appointments with your surgeon, nutritionist, primary care physician, and behavioral health practitioner. After the first year, please follow up with your bariatric surgeon at least once a year in order to maintain best weight loss results.  [facility]: [phone] [facility]: [phone]   Free counseling is available for you and your family through collaboration between [facility] and [place]. Please call [phone] and leave a message.    Consider purchasing a medical alert bracelet that says you had lap-band surgery.    [facility] has a free Bariatric Surgery Support Group that meets monthly, the 3rd Thursday, 6 pm, Classroom #1, [place]. You may register online at [email or web address], but registration is not necessary. Select Classes and Support Groups, Bariatric Surgery, or Call [phone]   Do not return to work or drive until cleared by your surgeon   Use your CPAP when sleeping if applicable   Do not lift anything greater than ten pounds for at least two weeks.   You will probably have your first fill (fluid added to your band) 6 weeks after surgery

## 2012-03-31 NOTE — Discharge Summary (Signed)
Physician Discharge Summary  Kim Brock YNW:[id number] DOB: April 28, 1960 DOA: 03/30/2012  PCP: Cala Bradford, MD  Admit date: 03/30/2012 Discharge date: 03/31/2012  Recommendations for Outpatient Follow-up:   Follow-up Information   Follow up with Atilano Ina, MD,FACS On 04/16/2012. (3:45 PM)    Contact information:   [address] [place] [address] [phone]      Discharge Diagnoses:  Patient Active Problem List  Diagnosis  . Morbid obesity with BMI of 50.0-59.9, adult  . HTN (hypertension)  . OSA on CPAP  . Diabetes mellitus    Surgical Procedure: Laparoscopic adjustable gastric band placement 03/30/2012  Discharge Condition: Good Disposition: To home  Diet recommendation: Postoperative bariatric diet-liquid  Filed Weights   03/30/12 0816  Weight: 360 lb 2 oz (163.352 kg)    Hospital Course:  52 year old morbidly obese Caucasian female was admitted to the hospital for planned laparoscopic adjustable gastric band placement. She underwent the procedure without any known complications. She was kept overnight for observation because of her obstructive sleep apnea. She was placed on her home CPAP settings. She was maintained on perioperative DVT prophylaxis which included subcutaneous heparin prior to surgery. She ambulated within several hours after surgery. On postoperative day one she was tolerating ice chips and sips of water without difficulty. Her vital signs are stable area her incisions were clean dry and intact. Her lungs were clear to auscultation. She has developed a abdominal mild rash. It was mainly on her abdomen and bilateral flanks. It is not on her extremities. It appears that she probably had a reaction to the ChloraPrep. Her pain was controlled. She was deemed stable for discharge   Discharge Instructions       Future Appointments Provider Department Dept Phone   04/06/2012 4:00 PM [facility] Post-Op Class [facility] [phone]   04/16/2012 3:45 PM Atilano Ina, MD,FACS [facility], [place] [phone]       Medication List    STOP taking these medications       diclofenac 75 MG EC tablet  Commonly known as:  VOLTAREN      TAKE these medications       albuterol 108 (90 BASE) MCG/ACT inhaler  Commonly known as:  PROVENTIL HFA;VENTOLIN HFA  Inhale 2 puffs into the lungs every 4 (four) hours as needed for shortness of breath.     amLODipine 2.5 MG tablet  Commonly known as:  NORVASC  Take 2.5 mg by mouth every morning.     escitalopram 20 MG tablet  Commonly known as:  LEXAPRO  Take 20 mg by mouth daily.     HYDROcodone-acetaminophen 5-325 MG per tablet  Commonly known as:  NORCO/VICODIN  Take 1-2 tablets by mouth every 4 (four) hours as needed.     KLONOPIN 0.5 MG tablet  Generic drug:  clonazePAM  Take 0.25 mg by mouth 3 (three) times a week.     lisinopril-hydrochlorothiazide 20-12.5 MG per tablet  Commonly known as:  PRINZIDE,ZESTORETIC  Take 1 tablet by mouth daily.     multivitamin with minerals Tabs  Take 1 tablet by mouth daily. Pt has not started yet       Follow-up Information   Follow up with Atilano Ina, MD,FACS On 04/16/2012. (3:45 PM)    Contact information:   [address] [place] [address] [phone]        The results of significant diagnostics from this hospitalization (including imaging,  microbiology, ancillary and laboratory) are listed below for reference.    Significant Diagnostic Studies: Dg Abd 1 View  03/31/2012  *RADIOLOGY REPORT*  Clinical Data: Postop laparoscopic gastric band placement.  ABDOMEN - 1 VIEW  Comparison: None.  Findings: A laparoscopic gastric band is seen in the epigastric region, at the 2 o'clock and 8 o'clock positions.  The port projects over the right upper quadrant.  Bowel gas pattern is unremarkable.  IMPRESSION: Laparoscopic gastric band appears in appropriate  position.   Original Report Authenticated By: Leanna Battles, M.D.     Microbiology: Recent Results (from the past 240 hour(s))  SURGICAL PCR SCREEN     Status: None   Collection Time    03/25/12  8:30 AM      Result Value Range Status   MRSA, PCR NEGATIVE  NEGATIVE Final   Staphylococcus aureus NEGATIVE  NEGATIVE Final   Comment:            The Xpert SA Assay (FDA     approved for NASAL specimens     in patients over 20 years of age),     is one component of     a comprehensive surveillance     program.  Test performance has     been validated by [employer] for patients greater     than or equal to 35 year old.     It is not intended     to diagnose infection nor to     guide or monitor treatment.     Labs: Basic Metabolic Panel:  Recent Labs Lab 03/25/12 0835  NA 137  K 3.9  CL 100  CO2 29  GLUCOSE 150*  BUN 15  CREATININE 0.70  CALCIUM 9.3   Liver Function Tests:  Recent Labs Lab 03/25/12 0835  AST 29  ALT 50*  ALKPHOS 58  BILITOT 0.3  PROT 6.8  ALBUMIN 3.8   CBC:  Recent Labs Lab 03/25/12 0835  WBC 5.9  NEUTROABS 3.5  HGB 13.5  HCT 39.8  MCV 87.7  PLT 264  CBG:  Recent Labs Lab 03/30/12 1611 03/30/12 2007 03/30/12 2333 03/31/12 0352 03/31/12 0754  GLUCAP 120* 126* 103* 107* 102*    Active Problems:   Morbid obesity with BMI of 50.0-59.9, adult   HTN (hypertension)   OSA on CPAP   Diabetes mellitus   Time coordinating discharge: 10 minutes  Signed:  Atilano Ina, MD [facility], [place] [phone] 03/31/2012, 10:52 AM

## 2012-04-01 ENCOUNTER — Encounter (HOSPITAL_COMMUNITY): Payer: Self-pay | Admitting: General Surgery

## 2012-04-06 ENCOUNTER — Encounter: Payer: 59 | Attending: General Surgery | Admitting: *Deleted

## 2012-04-06 VITALS — Ht 65.5 in | Wt 340.9 lb

## 2012-04-06 DIAGNOSIS — Z713 Dietary counseling and surveillance: Secondary | ICD-10-CM | POA: Insufficient documentation

## 2012-04-06 DIAGNOSIS — Z01818 Encounter for other preprocedural examination: Secondary | ICD-10-CM | POA: Insufficient documentation

## 2012-04-06 NOTE — Patient Instructions (Addendum)
Patient to follow Phase 3A-Soft, High Protein Diet and follow-up at [facility] in 4 weeks for 6 weeks post-op nutrition visit for diet advancement. 

## 2012-04-06 NOTE — Progress Notes (Signed)
Bariatric Class:  Appt start time: 1600 end time:  1700.  2 Week Post-Operative Nutrition Class  Patient was seen on 04/06/2012 for Post-Operative Nutrition [facility].   Surgery date: 02/02/12 Surgery type: LAGB [facility] weight at [facility]: 361.9 lbs (12/29/11) Goal weight: 180-200 lbs  Weight today: 340.9 lbs Weight change: 20.2 lbs Total weight lost: 21.0 lbs BMI: 55.9  The following the learning objectives were met by the patient during this course:  Identifies Phase 3A (Soft, High Proteins) Dietary Goals and will begin from 2 weeks post-operatively to 2 months post-operatively  Identifies appropriate sources of fluids and proteins   States protein recommendations and appropriate sources post-operatively  Identifies the need for appropriate texture modifications, mastication, and bite sizes when consuming solids  Identifies appropriate multivitamin and calcium sources post-operatively  Describes the need for physical activity post-operatively and will follow MD recommendations  States when to call healthcare Demarquez Ciolek regarding medication questions or post-operative complications  Handouts given during class include:  Phase 3A: Soft, High Protein Diet Handout  Band Fill Guidelines Handout  Follow-Up Plan: Patient will follow-up at [facility] in 4 weeks for 6 weeks post-op nutrition visit for diet advancement per MD.

## 2012-04-12 ENCOUNTER — Encounter (INDEPENDENT_AMBULATORY_CARE_PROVIDER_SITE_OTHER): Payer: Self-pay | Admitting: *Deleted

## 2012-04-12 ENCOUNTER — Telehealth (INDEPENDENT_AMBULATORY_CARE_PROVIDER_SITE_OTHER): Payer: Self-pay | Admitting: *Deleted

## 2012-04-12 NOTE — Telephone Encounter (Signed)
Patient called to request a return to work note for 04/13/12.  Letter was placed at the front desk for patient to pick up this afternoon.

## 2012-04-16 ENCOUNTER — Ambulatory Visit (INDEPENDENT_AMBULATORY_CARE_PROVIDER_SITE_OTHER): Payer: 59 | Admitting: General Surgery

## 2012-04-16 ENCOUNTER — Encounter (INDEPENDENT_AMBULATORY_CARE_PROVIDER_SITE_OTHER): Payer: Self-pay | Admitting: General Surgery

## 2012-04-16 VITALS — BP 132/82 | HR 68 | Temp 98.0°F | Resp 18 | Ht 65.5 in | Wt 338.0 lb

## 2012-04-16 DIAGNOSIS — Z09 Encounter for follow-up examination after completed treatment for conditions other than malignant neoplasm: Secondary | ICD-10-CM

## 2012-04-16 NOTE — Patient Instructions (Signed)
1. Stay on liquids for the next 1- 2 days as you adapt to your new fill volume.  Then resume your previous diet. 2. Decreasing your carbohydrate intake will hasten you weight loss.  Rely more on proteins for your meals.  Avoid condiments that contain sweets such as Honey Mustard and sugary salad dressings.   3. Stay in the "green zone".  If you are regurgitating with meals, having night time reflux, and find yourself eating soft comfort foods (mashed potatoes, potato chips)...realize that you are developing "maladaptive eating".  You will not lose weight this way and may regain weight.  The GREEN ZONE is eating smaller portions and not regurgitating.  Hence we may need to withdraw fluid from your band. 4. Build exercise into your daily routine.  Walking is the best way to start but do something every day if you can.   5. Take 1 small bite at a time, chew thoroughly, and let sit for 20-30 sec before next bite

## 2012-04-16 NOTE — Progress Notes (Signed)
Subjective:     Patient ID: Kim Brock, female   DOB: [birth year], 52 y.o.   MRN: [id number]  HPI 52 year old morbidly obese Caucasian female comes in for her first postoperative appointment after undergoing laparoscopic adjustable gastric band placement on 03/30/2012. She saw the nutritionist earlier this week and was advanced to solid proteins.she denies any fever, chills, diarrhea. She had one day of constipation and it has corrected itself after taking some correct all. She denies any problems with her incisions. She did have a reaction to the ChloraPrep that was used to prep her skin at surgery. She states that she has had one episode of regurgitation and that was with some pork that she tried eating the other night. She felt that she did not chew thoroughly enough. She has been doing some walking. She has been walking her dog in her neighborhood. She has returned to work. She denies any heartburn or indigestion. She denies any nighttime cough or wheezing  Review of Systems     Objective:   Physical Exam BP 132/82  Pulse 68  Temp(Src) 98 F (36.7 C)  Resp 18  Ht 5' 5.5" (1.664 m)  Wt 338 lb (153.316 kg)  BMI 55.37 kg/m2  LMP 02/04/2011  See LapBand flowsheet  Gen: alert, NAD, non-toxic appearing HEENT: normocephalic, atraumatic; pupils equal, no scleral icterus, neck supple, no lymphadenopathy Pulm: Lungs clear to auscultation, symmetric chest rise CV: regular rate and rhythm Abd: soft, nontender, nondistended. Well-healed trocar sites. No incisional hernia. Port is in right mid-abdomen Ext: no edema, normal, symmetric strength Neuro: nonfocal, sensation grossly intact Psych: appropriate, judgment normal     Assessment:     Morbid obesity Hypertension Obstructive sleep apnea on CPAP Diabetes mellitus Degenerative joint disease     Plan:     I congratulated her on her weight loss. So far she has lost approximately 23 pounds since surgery. I also congratulated her  on walking. I encouraged her to keep up with this. We discussed the importance of getting regular routine exercise. We also spent a good deal of time talking about proper eating techniques such as taking one bite at that time, chewing very thoroughly, and waiting 20-30 seconds before the next bite of food. I told her I am okay with her resuming some caffeine in limited amounts.  After obtaining verbal consent, the abdominal wall was prepped with Chloraprep. The port was accessed with a Huber needle and 1 cc of saline was added to give the patient an expected fill volume of  cc.  The patient was able to tolerate sips of water.  She was instructed to stay on a liquid diet for the next 24 hours. Followup in 4 weeks  Mary Sella. Andrey Campanile, MD, FACS General, Bariatric, & Minimally Invasive Surgery [facility], [place]

## 2012-05-04 ENCOUNTER — Encounter: Payer: 59 | Attending: General Surgery | Admitting: *Deleted

## 2012-05-04 ENCOUNTER — Encounter: Payer: Self-pay | Admitting: *Deleted

## 2012-05-04 VITALS — Ht 65.5 in | Wt 331.5 lb

## 2012-05-04 DIAGNOSIS — Z6841 Body Mass Index (BMI) 40.0 and over, adult: Secondary | ICD-10-CM

## 2012-05-04 DIAGNOSIS — Z01818 Encounter for other preprocedural examination: Secondary | ICD-10-CM | POA: Insufficient documentation

## 2012-05-04 DIAGNOSIS — Z713 Dietary counseling and surveillance: Secondary | ICD-10-CM | POA: Insufficient documentation

## 2012-05-04 NOTE — Patient Instructions (Addendum)
Goals:  Follow Phase 3B: High Protein + Non-Starchy Vegetables  Eat 3-6 small meals/snacks, every 3-5 hrs  Increase lean protein foods to meet 60-80g goal  Increase fluid intake to 64oz +  Avoid drinking 15 minutes before, during and 30 minutes after eating  Aim for >30 min of physical activity daily 

## 2012-05-04 NOTE — Progress Notes (Signed)
  Follow-up visit:  6 Weeks Post-Operative LAGB Surgery  Medical Nutrition Therapy:  Appt start time: 1230   End time:  1300.  Primary concerns today: Post-operative Bariatric Surgery Nutrition Management. Doing well, but is "starving". Reports she is following all post-op nutrition protocols.  Surgery date: 02/02/12 Surgery type: LAGB Start weight at [facility]: 361.9 lbs (12/29/11)  Weight today: 331.5 lbs Weight change: 9.4 lbs Total weight lost: 30.4 lbs  Goal weight: 180-200 lbs  TANITA  BODY COMP RESULTS  05/04/12   BMI (kg/m^2) 54.3   Fat Mass (lbs) 178.5   Fat Free Mass (lbs) 153.0   Total Body Water (lbs) 112.0   24-hr recall: B (AM): Protein shake (Premier or Unjury w/ 1% milk) - 30g Snk (AM): Celery  L (PM): 3 oz chicken w/ string cheese or chicken salad w/ regular mayo Snk (PM): Celery or lite & fit yogurt D (PM):  3 oz cheeseburger (no bun) in lettuce wrap, 2 tbsp baked beans or pinto beans, 4 slices homemade sweet pickles Snk (PM): NONE or celery  Fluid intake: Tall skinny latte (reg-built on website - 8g pro); 64 oz water, 11 oz pro shake:  Estimated total protein intake: 70-80g  Medications: No changes reported Supplementation: Taking  CBG monitoring: No longer checking  Average FBG per patient: 120 mg Last patient reported A1c: 5.8%  Using straws: No Drinking while eating: No Hair loss: No Carbonated beverages: No N/V/D/C: No Last Lap-Band fill:  04/16/12; Reports she is "starving" and needs another fill.  Recent physical activity:  No structured exercise, but worked in her yard for 3 hours last weekend. Joined the [employer] and has another   Progress Towards Goal(s):  In progress.  Handouts given during visit include:  Phase 3B: High Protein + Non-Starchy Vegetables   Nutritional Diagnosis:  [place]-3.3 Overweight/obesity related to past poor dietary habits and physical inactivity as evidenced by patient w/ recent LAGB surgery following dietary guidelines for  continued weight loss.    Intervention:  Nutrition education/diet advancement.  Monitoring/Evaluation:  Dietary intake, exercise, lap band fills, and body weight. Follow up in 1 month for 3 month post-op visit.

## 2012-05-10 ENCOUNTER — Encounter (INDEPENDENT_AMBULATORY_CARE_PROVIDER_SITE_OTHER): Payer: Self-pay | Admitting: General Surgery

## 2012-05-10 ENCOUNTER — Ambulatory Visit (INDEPENDENT_AMBULATORY_CARE_PROVIDER_SITE_OTHER): Payer: 59 | Admitting: General Surgery

## 2012-05-10 VITALS — BP 136/74 | HR 74 | Temp 98.8°F | Resp 18 | Ht 65.5 in | Wt 329.0 lb

## 2012-05-10 DIAGNOSIS — Z09 Encounter for follow-up examination after completed treatment for conditions other than malignant neoplasm: Secondary | ICD-10-CM

## 2012-05-10 NOTE — Progress Notes (Signed)
Subjective:     Patient ID: Kim Brock, female   DOB: [birth year], 52 y.o.   MRN: [id number]  HPI 52 year old Caucasian female status post laparoscopic adjustable gastric band placement comes in for her second postoperative appointment. I last saw her in the office on March 14. Her surgery was February 25. Her last office weight was 338 pounds. She states overall she has done well. However she does not have any restriction. She reports increased energy and activity since surgery. She has joined a gym and is going twice a week. On one of the days that she works out with a Psychologist, educational. She has cheated once or twice. She ate a hot dog with the bun. She did have a small serving of flan - "but it had lots of eggs in it". She has reduced her alcohol consumption by a significant amount. She has only had 4 ounces of wine since surgery.  Review of Systems     Objective:   Physical Exam BP 136/74  Pulse 74  Temp(Src) 98.8 F (37.1 C)  Resp 18  Ht 5' 5.5" (1.664 m)  Wt 329 lb (149.233 kg)  BMI 53.9 kg/m2 Alert, nad Nonfocal, mae Obese, soft, NT, ND. Well healed incisions. Port - right mid abdomen    Assessment:     Morbid obesity-BMI 53.9 Hypertension Obstructive sleep apnea on CPAP Diabetes mellitus Degenerative joint disease     Plan:     Overall I think she is doing quite well. Total weight loss has been 32 pounds. She has lost 11 pounds since her last office visit. I congratulated her on her weight loss. I continue to stress the importance of good food choices. I also stressed the importance of continuing with routine exercise. Since she is only going to the gym twice a week I encouraged her to get a pedometer and track her step count. She has already seen the nutritionist again since her last visit. She states she may followup with her orthopedic surgeon to get an injection in her left knee.  Since she has no restriction we performed an adjustment today.  After obtaining verbal  consent, the abdominal wall was prepped with Chloraprep. The port was accessed with a Huber needle and 1 cc of saline was added to give the patient an expected fill volume of  cc.  The patient was able to tolerate sips of water.  She was instructed to stay on liquids for the next 24-48 hours. Followup in 4 weeks  Mary Sella. Andrey Campanile, MD, FACS General, Bariatric, & Minimally Invasive Surgery [facility], [place]

## 2012-05-10 NOTE — Patient Instructions (Signed)
1. Stay on liquids for the next 2 days as you adapt to your new fill volume.  Then resume your previous diet. 2. Decreasing your carbohydrate intake will hasten you weight loss.  Rely more on proteins for your meals.  Avoid condiments that contain sweets such as Honey Mustard and sugary salad dressings.   3. Stay in the "green zone".  If you are regurgitating with meals, having night time reflux, and find yourself eating soft comfort foods (mashed potatoes, potato chips)...realize that you are developing "maladaptive eating".  You will not lose weight this way and may regain weight.  The GREEN ZONE is eating smaller portions and not regurgitating.  Hence we may need to withdraw fluid from your band. 4. Build exercise into your daily routine.  Walking is the best way to start but do something every day if you can.   5. Consider getting a pedometer and tracking your steps and making weekly goals to increase your daily step count

## 2012-05-18 ENCOUNTER — Ambulatory Visit: Payer: 59 | Admitting: *Deleted

## 2012-06-08 ENCOUNTER — Encounter: Payer: Self-pay | Admitting: *Deleted

## 2012-06-08 ENCOUNTER — Encounter: Payer: 59 | Attending: General Surgery | Admitting: *Deleted

## 2012-06-08 VITALS — Ht 65.5 in | Wt 319.5 lb

## 2012-06-08 DIAGNOSIS — Z01818 Encounter for other preprocedural examination: Secondary | ICD-10-CM | POA: Insufficient documentation

## 2012-06-08 DIAGNOSIS — Z6841 Body Mass Index (BMI) 40.0 and over, adult: Secondary | ICD-10-CM

## 2012-06-08 DIAGNOSIS — Z713 Dietary counseling and surveillance: Secondary | ICD-10-CM | POA: Insufficient documentation

## 2012-06-08 NOTE — Patient Instructions (Addendum)
Goals:  Continue Phase 3B: High Protein + Non-Starchy Vegetables  Continue lean protein foods to meet 60-80g goal  Continue fluid intake of 64oz +  Limit carbs to 15 grams (fruit, whole grains) with meals  Always have protein with carbohydrates  Avoid drinking 15 minutes before, during and 30 minutes after eating  Aim for >30 min of physical activity daily

## 2012-06-08 NOTE — Progress Notes (Signed)
  Follow-up visit:  10 Weeks Post-Operative LAGB Surgery  Medical Nutrition Therapy:  Appt start time:  945   End time:  1015.  Primary concerns today: Post-operative Bariatric Surgery Nutrition Management. Doing well, but states she "will have" certain foods (i.e. Green peas, etc). Urged her to limit fruit and avoid starchy vegetables/starches to help facilitate weight loss. Has been consuming the dessert flan and, until recently, has averaged several servings a week (splits 1 svg over 3 days). Had long discussion about the effects of these foods on weight loss.  Question when she will hit a wall.   Surgery date: 03/30/12 Surgery type: LAGB Start weight at [facility]: 361.9 lbs (12/29/11)  Weight today: 319.5 lbs Weight change: 12.0 lbs Total weight lost: 42.4 lbs  Goal weight: 180-200 lbs % goal met: 23-26%  TANITA  BODY COMP RESULTS  05/04/12 06/08/12   BMI (kg/m^2) 54.3 52.4   Fat Mass (lbs) 178.5 170.0   Fat Free Mass (lbs) 153.0 149.5   Total Body Water (lbs) 112.0 109.5   24-hr recall: B (AM): 3.5 oz tuna w/ 1 tbsp mayo/cocktail sauce OR Premier shake (1-2 time/wk) = 25-30g Snk (AM): Celery w/ [place] hot sauce  L (PM): 3 oz chicken Snk (PM): Celery or lite & fit yogurt D (PM):  3 oz hamburger with 6 tortilla chips and salsa Snk (PM): NONE or celery  Fluid intake: Tall skinny latte (reg-built on website - 8g pro); 64+ oz water Estimated total protein intake: 70-80g  Medications: No changes reported Supplementation: Taking MVI daily; takes calcium "when I can remember"  CBG monitoring: No longer checking  Average FBG per patient:  Unknown Last patient reported A1c: 5.8% (goes next month for new A1c)  Using straws: No Drinking while eating: No Hair loss: No Carbonated beverages: No N/V/D/C: Mild constipation off and on; not bothersome. Feels like she's "having a heart attack" if eats too fast. Last Lap-Band fill:  05/10/12; Reports she is still eating large portions without  regurgitation or feelings of fullness; Feels she needs another fill.  Recent physical activity:  No structured exercise, but works in her yard raking, chopping and carrying wood for 2-3 hours @ 3-4 times/week. Belongs to the [place], but not going consistently.    Progress Towards Goal(s):  In progress.  Nutritional Diagnosis:  [place]-3.3 Overweight/obesity related to past poor dietary habits and physical inactivity as evidenced by patient w/ recent LAGB surgery following dietary guidelines for continued weight loss.    Intervention:  Nutrition education/diet advancement.  Monitoring/Evaluation:  Dietary intake, exercise, lap band fills, and body weight. Follow up in 2 months for 4 month post-op visit.

## 2012-06-18 ENCOUNTER — Encounter (INDEPENDENT_AMBULATORY_CARE_PROVIDER_SITE_OTHER): Payer: Self-pay | Admitting: General Surgery

## 2012-06-18 ENCOUNTER — Ambulatory Visit (INDEPENDENT_AMBULATORY_CARE_PROVIDER_SITE_OTHER): Payer: 59 | Admitting: General Surgery

## 2012-06-18 VITALS — BP 124/76 | HR 71 | Temp 98.2°F | Resp 18 | Ht 65.5 in | Wt 320.6 lb

## 2012-06-18 DIAGNOSIS — Z9884 Bariatric surgery status: Secondary | ICD-10-CM

## 2012-06-18 NOTE — Patient Instructions (Signed)
1. Stay on liquids for the next 1- 2 days as you adapt to your new fill volume.  Then resume your previous diet. 2. Decreasing your carbohydrate intake will hasten you weight loss.  Rely more on proteins for your meals.  Avoid condiments that contain sweets such as Honey Mustard and sugary salad dressings.   3. Stay in the "green zone".  If you are regurgitating with meals, having night time reflux, and find yourself eating soft comfort foods (mashed potatoes, potato chips)...realize that you are developing "maladaptive eating".  You will not lose weight this way and may regain weight.  The GREEN ZONE is eating smaller portions and not regurgitating.  Hence we may need to withdraw fluid from your band. 4. Build exercise into your daily routine.  Walking is the best way to start but do something every day if you can.    

## 2012-06-18 NOTE — Progress Notes (Signed)
Subjective:     Patient ID: Kim Brock, female   DOB: May 29, 1960, 52 y.o.   MRN: [id number]  HPI 52 year old Caucasian female comes in for additional followup after undergoing laparoscopic adjustable gastric band placement on every 25th 2014. I last saw her in the office on 05/10/2012. At that time her weight was 329 pounds. Today she comes in for followup. Today her weight is 320.6 pounds. She states that she has been doing well since her last adjustment. She denies any regurgitation. She denies any reflux or nighttime cough. She is still using her CPAP although she is having some difficulty at night with it and plans to followup with her pulmonologist to discuss it. She reports that she is taking her multivitamin. She states that she is not doing any formal exercise regimen at the present moment however she is working several hours a day in her yard. She did submit the paperwork for the [facility] program and is awaiting her pedometer. She was at a recent conference and the food choices that were available were not ideal. She did have some chocolate moose. She states that she still does not have much restriction. She is still hungry  Review of Systems     Objective:   Physical Exam BP 124/76  Pulse 71  Temp(Src) 98.2 F (36.8 C) (Temporal)  Resp 18  Ht 5' 5.5" (1.664 m)  Wt 320 lb 9.6 oz (145.423 kg)  BMI 52.52 kg/m2  See LapBand flowsheet  Gen: alert, NAD, non-toxic appearing HEENT: normocephalic, atraumatic; pupils equal, no scleral icterus, neck supple, no lymphadenopathy Pulm: Lungs clear to auscultation, symmetric chest rise CV: regular rate and rhythm Abd: soft, nontender, nondistended. Well-healed trocar sites. No incisional hernia. Port is in right mid-abdomen Ext: no edema, normal, symmetric strength Neuro: nonfocal, sensation grossly intact Psych: appropriate, judgment normal     Assessment:     S/p LAGB     Plan:     Total weight loss since surgery has been  approximately 40.4 pounds. I congratulated her on her weight loss. I again stressed the importance of incorporating a regular exercise program such as walking. Discussed using a pedometer in tracking her steps. She is attending the bariatric support group and realizes that she still has to work on some internal issues like body self perception. We rediscussed proper eating techniques. He also discussed the importance of proper food choices  After obtaining verbal consent, the abdominal wall was prepped with Chloraprep. The port was accessed with a Huber needle and 0.5 cc of saline was added to give the patient an expected fill volume of 5.2 cc.  The patient was able to tolerate sips of water.  F/u 4 weeks  Mary Sella. Andrey Campanile, MD, FACS General, Bariatric, & Minimally Invasive Surgery [facility], [place]

## 2012-06-29 ENCOUNTER — Telehealth (INDEPENDENT_AMBULATORY_CARE_PROVIDER_SITE_OTHER): Payer: Self-pay

## 2012-06-29 NOTE — Telephone Encounter (Signed)
LMOM that I rescheduled her appointment time on 6/11 for 12:00 due to staff meeting.

## 2012-06-29 NOTE — Telephone Encounter (Signed)
Message copied by Brennan Bailey on Tue Jun 29, 2012 10:07 AM ------      Message from: Andrey Campanile, ERIC M      Created: Thu Jun 24, 2012  8:40 AM       Didn't realize y'all had meeting that morning. Move her to noon or she can see Garlan Fillers. Andrey Campanile, MD, FACS      General, Bariatric, & Minimally Invasive Surgery      [facility] [place]            ----- Message -----         From: Brennan Bailey, CMA         Sent: 06/22/2012  12:45 PM           To: Atilano Ina, MD            Dr Andrey Campanile,      Last week you had me put this patient in at 8:45 on June 11. Sonja sent a message that we have a staff meeting that morning and we cannot start your clinic until 9. Do you want me to move her to the end of clinic that day at 12:00?            Thanks,      Marcelino Duster       ------

## 2012-07-14 ENCOUNTER — Ambulatory Visit (INDEPENDENT_AMBULATORY_CARE_PROVIDER_SITE_OTHER): Payer: 59 | Admitting: General Surgery

## 2012-07-14 ENCOUNTER — Encounter (INDEPENDENT_AMBULATORY_CARE_PROVIDER_SITE_OTHER): Payer: 59 | Admitting: General Surgery

## 2012-07-14 ENCOUNTER — Encounter (INDEPENDENT_AMBULATORY_CARE_PROVIDER_SITE_OTHER): Payer: Self-pay | Admitting: General Surgery

## 2012-07-14 VITALS — BP 132/80 | HR 76 | Temp 97.2°F | Resp 16 | Ht 65.5 in | Wt 313.4 lb

## 2012-07-14 DIAGNOSIS — Z9884 Bariatric surgery status: Secondary | ICD-10-CM

## 2012-07-14 NOTE — Progress Notes (Signed)
Subjective:     Patient ID: Kim Brock, female   DOB: 10/27/1960, 52 y.o.   MRN: [id number]  HPI 52 year old Caucasian femalele comes in for additional followup after undergoing laparoscopic adjustable gastric band placement on every 25th 2014. I last saw her in the office on 06/18/2012. At that time her weight was 320.6pounds. Today she comes in for followup. Today her weight is 313.4 pounds. She states that she has been doing well since her last adjustment. She had 1 episode of regurgitation that was after crying for awhile after a stressful day at work. She denies nighttime cough. She had an episode of reflux after eating some greasy food. She did eat some wings. She states she could only eat 5 wings. She saw Dr Mayford Knife about her CPAP and she has recommended an alternating set-up which will require a new machine. The pt reports she is currently working with her insurance for approval. She is still working several hours a day in her yard 2-3 times a week. . She did submit the paperwork for the [facility] program and is awaiting her pedometer. She could tell a difference after her last adjustment. She can't drink water as rapidly as she could. She is working with a Psychologist, educational one day a week. She reports her A1C is down to 5.3 and her urine microalbumin is significantly improved. Her PCP is contemplating taking her off the norvasc.   PMHx, PSHx, SOCHx, FAMHx, ALL reviewed   Review of Systems 8 point ROS performed and all systems negative except for above    Objective:   Physical Exam BP 132/80  Pulse 76  Temp(Src) 97.2 F (36.2 C) (Temporal)  Resp 16  Ht 5' 5.5" (1.664 m)  Wt 313 lb 6.4 oz (142.157 kg)  BMI 51.34 kg/m2  See LapBand flowsheet  Gen: alert, NAD, non-toxic appearing HEENT: normocephalic, atraumatic; pupils equal, no scleral icterus, neck supple, no lymphadenopathy Pulm: Lungs clear to auscultation, symmetric chest rise CV: regular rate and rhythm Abd: soft, nontender, nondistended.  Well-healed trocar sites. No incisional hernia. Port is in right mid-abdomen Ext: no edema, normal, symmetric strength Neuro: nonfocal, sensation grossly intact Psych: appropriate, judgment normal     Assessment:     S/p LAGB     Plan:     Total weight loss since surgery has been approximately 47.4 pounds. I congratulated her on her weight loss. I again stressed the importance of incorporating a regular exercise program such as walking. Discussed using a pedometer in tracking her steps. She is attending the bariatric support group and realizes that she still has to work on some internal issues like body self perception. We rediscussed proper eating techniques. We also discussed the importance of proper food choices  After obtaining verbal consent, the abdominal wall was prepped with Chloraprep. The port was accessed with a Huber needle and 0.25 cc of saline was added to give the patient an expected fill volume of 5.45 cc.  The patient was able to tolerate sips of water.  F/u 6 weeks  Mary Sella. Andrey Campanile, MD, FACS General, Bariatric, & Minimally Invasive Surgery [facility], [place]

## 2012-07-14 NOTE — Patient Instructions (Signed)
1. Stay on liquids for the next 1- 2 days as you adapt to your new fill volume.  Then resume your previous diet. 2. Decreasing your carbohydrate intake will hasten you weight loss.  Rely more on proteins for your meals.  Avoid condiments that contain sweets such as Honey Mustard and sugary salad dressings.   3. Stay in the "green zone".  If you are regurgitating with meals, having night time reflux, and find yourself eating soft comfort foods (mashed potatoes, potato chips)...realize that you are developing "maladaptive eating".  You will not lose weight this way and may regain weight.  The GREEN ZONE is eating smaller portions and not regurgitating.  Hence we may need to withdraw fluid from your band. 4. Build exercise into your daily routine.  Walking is the best way to start but do something every day if you can.    

## 2012-08-11 ENCOUNTER — Ambulatory Visit: Payer: 59 | Admitting: *Deleted

## 2012-08-26 ENCOUNTER — Encounter (INDEPENDENT_AMBULATORY_CARE_PROVIDER_SITE_OTHER): Payer: Self-pay

## 2012-08-26 ENCOUNTER — Ambulatory Visit (INDEPENDENT_AMBULATORY_CARE_PROVIDER_SITE_OTHER): Payer: 59 | Admitting: Physician Assistant

## 2012-08-26 VITALS — BP 140/88 | HR 90 | Resp 18 | Ht 65.5 in | Wt 312.0 lb

## 2012-08-26 DIAGNOSIS — Z4651 Encounter for fitting and adjustment of gastric lap band: Secondary | ICD-10-CM

## 2012-08-26 NOTE — Progress Notes (Signed)
  HISTORY: CORDA SHUTT is a 52 y.o.female who received an AP-Large lap-band in February 2014 by Dr. Andrey Campanile. She comes in with about 1 lb weight loss since her last visit one month ago. She's had significant work stressors in the past few weeks that's affected her schedule as well as her ability to prepare foods as she desires. She sees this resolving in a couple of weeks so she'll be able to get back on track. She continues to have hunger and is eating larger portions than desired. She has no regurgitation or reflux symptoms at all. She wants a fill today and is requesting a little more volume.  VITAL SIGNS: Filed Vitals:   08/26/12 1629  BP: 140/88  Pulse: 90  Resp: 18    PHYSICAL EXAM: Physical exam reveals a very well-appearing 52 y.o.female in no apparent distress Neurologic: Awake, alert, oriented Psych: Bright affect, conversant Respiratory: Breathing even and unlabored. No stridor or wheezing Abdomen: Soft, nontender, nondistended to palpation. Incisions well-healed. No incisional hernias. Port easily palpated. Extremities: Atraumatic, good range of motion.  ASSESMENT: 52 y.o.  female  s/p AP-Large lap-band.   PLAN: The patient's port was accessed with a 20G Huber needle without difficulty. Clear fluid was aspirated and 0.5 mL saline was added to the port to give a total predicted volume of 5.95 mL. The patient was able to swallow water without difficulty following the procedure and was instructed to take clear liquids for the next 24-48 hours and advance slowly as tolerated.

## 2012-08-26 NOTE — Patient Instructions (Signed)

## 2012-09-23 ENCOUNTER — Ambulatory Visit (INDEPENDENT_AMBULATORY_CARE_PROVIDER_SITE_OTHER): Payer: 59 | Admitting: Physician Assistant

## 2012-09-23 ENCOUNTER — Encounter (INDEPENDENT_AMBULATORY_CARE_PROVIDER_SITE_OTHER): Payer: Self-pay

## 2012-09-23 VITALS — BP 130/82 | HR 74 | Resp 16 | Ht 65.5 in | Wt 308.0 lb

## 2012-09-23 DIAGNOSIS — Z4651 Encounter for fitting and adjustment of gastric lap band: Secondary | ICD-10-CM

## 2012-09-23 NOTE — Patient Instructions (Signed)

## 2012-09-23 NOTE — Progress Notes (Signed)
  HISTORY: Kim Brock is a 52 y.o.female who received an AP-Large lap-band in February 2014 by Dr. Andrey Campanile. She has lost 4 lbs since her last fill. She is beginning to feel restriction at this point but fortunately no regurgitation or reflux symptoms. She would like a fill today to keep on track.  VITAL SIGNS: Filed Vitals:   09/23/12 1601  BP: 130/82  Pulse: 74  Resp: 16    PHYSICAL EXAM: Physical exam reveals a very well-appearing 52 y.o.female in no apparent distress Neurologic: Awake, alert, oriented Psych: Bright affect, conversant Respiratory: Breathing even and unlabored. No stridor or wheezing Abdomen: Soft, nontender, nondistended to palpation. Incisions well-healed. No incisional hernias. Port easily palpated. Extremities: Atraumatic, good range of motion.  ASSESMENT: 52 y.o.  female  s/p AP-Large lap-band.   PLAN: The patient's port was accessed with a 20G Huber needle without difficulty. Clear fluid was aspirated and 0.5 mL saline was added to the port to give a total predicted volume of 6.45 mL. The patient was able to swallow water without difficulty following the procedure and was instructed to take clear liquids for the next 24-48 hours and advance slowly as tolerated.

## 2012-09-30 ENCOUNTER — Encounter (INDEPENDENT_AMBULATORY_CARE_PROVIDER_SITE_OTHER): Payer: 59

## 2012-10-21 ENCOUNTER — Encounter (INDEPENDENT_AMBULATORY_CARE_PROVIDER_SITE_OTHER): Payer: 59

## 2012-10-28 ENCOUNTER — Encounter (INDEPENDENT_AMBULATORY_CARE_PROVIDER_SITE_OTHER): Payer: Self-pay

## 2012-10-28 ENCOUNTER — Encounter (INDEPENDENT_AMBULATORY_CARE_PROVIDER_SITE_OTHER): Payer: 59

## 2012-10-28 ENCOUNTER — Ambulatory Visit (INDEPENDENT_AMBULATORY_CARE_PROVIDER_SITE_OTHER): Payer: 59 | Admitting: Physician Assistant

## 2012-10-28 VITALS — BP 112/64 | HR 90 | Temp 97.2°F | Ht 65.5 in | Wt 304.2 lb

## 2012-10-28 DIAGNOSIS — Z4651 Encounter for fitting and adjustment of gastric lap band: Secondary | ICD-10-CM

## 2012-10-28 NOTE — Progress Notes (Signed)
  HISTORY: Kim Brock is a 52 y.o.female who received an AP-Large lap-band in February 2014 by Dr. Andrey Campanile. She comes in with 4 lbs further weight loss since her last visit. She is feeling more restriction with the band but she still gets hungry sooner than expected.  She denies regurgitation or reflux symptoms. She would like to get below 300 lbs soon.  VITAL SIGNS: Filed Vitals:   10/28/12 1515  BP: 112/64  Pulse: 90  Temp: 97.2 F (36.2 C)    PHYSICAL EXAM: Physical exam reveals a very well-appearing 52 y.o.female in no apparent distress Neurologic: Awake, alert, oriented Psych: Bright affect, conversant Respiratory: Breathing even and unlabored. No stridor or wheezing Abdomen: Soft, nontender, nondistended to palpation. Incisions well-healed. No incisional hernias. Port easily palpated. Extremities: Atraumatic, good range of motion.  ASSESMENT: 52 y.o.  female  s/p AP-Large lap-band.   PLAN: The patient's port was accessed with a 20G Huber needle without difficulty. Clear fluid was aspirated and 0.5 mL saline was added to the port to give a total predicted volume of 6.95 mL. The patient was able to swallow water without difficulty following the procedure and was instructed to take clear liquids for the next 24-48 hours and advance slowly as tolerated.

## 2012-10-28 NOTE — Patient Instructions (Signed)

## 2012-11-25 ENCOUNTER — Encounter (INDEPENDENT_AMBULATORY_CARE_PROVIDER_SITE_OTHER): Payer: 59

## 2012-12-02 ENCOUNTER — Encounter (INDEPENDENT_AMBULATORY_CARE_PROVIDER_SITE_OTHER): Payer: Self-pay

## 2012-12-02 ENCOUNTER — Ambulatory Visit (INDEPENDENT_AMBULATORY_CARE_PROVIDER_SITE_OTHER): Payer: 59 | Admitting: Physician Assistant

## 2012-12-02 VITALS — BP 136/80 | HR 78 | Temp 97.4°F | Resp 46 | Ht 65.5 in | Wt 303.4 lb

## 2012-12-02 DIAGNOSIS — Z4651 Encounter for fitting and adjustment of gastric lap band: Secondary | ICD-10-CM

## 2012-12-02 NOTE — Patient Instructions (Signed)

## 2012-12-02 NOTE — Progress Notes (Signed)
  HISTORY: Kim Brock is a 51 y.o.female who received an AP-Large lap-band in February 2014 by Dr. Andrey Campanile. She comes in with stable weight but she complains of constipation that has worsened over the past two months. She has regurgitation only when she eats mindlessly but otherwise is able to eat reasonably sized portions. She would like a fill to keep hunger under control.  VITAL SIGNS: Filed Vitals:   12/02/12 1156  BP: 136/80  Pulse: 78  Temp: 97.4 F (36.3 C)  Resp: 46    PHYSICAL EXAM: Physical exam reveals a very well-appearing 52 y.o.female in no apparent distress Neurologic: Awake, alert, oriented Psych: Bright affect, conversant Respiratory: Breathing even and unlabored. No stridor or wheezing Abdomen: Soft, nontender, nondistended to palpation. Incisions well-healed. No incisional hernias. Port easily palpated. Extremities: Atraumatic, good range of motion.  ASSESMENT: 52 y.o.  female  s/p AP-Lage lap-band.   PLAN: The patient's port was accessed with a 20G Huber needle without difficulty. Clear fluid was aspirated and 0.5 mL saline was added to the port to give a total predicted volume of 7.5 mL. The patient was able to swallow water without difficulty following the procedure and was instructed to take clear liquids for the next 24-48 hours and advance slowly as tolerated. I recommended Miralax as needed with colace as a stool softener. We'll have her back in six weeks.

## 2012-12-09 ENCOUNTER — Other Ambulatory Visit: Payer: Self-pay

## 2013-01-12 ENCOUNTER — Encounter: Payer: Self-pay | Admitting: General Surgery

## 2013-01-13 ENCOUNTER — Ambulatory Visit (INDEPENDENT_AMBULATORY_CARE_PROVIDER_SITE_OTHER): Payer: 59 | Admitting: Physician Assistant

## 2013-01-13 ENCOUNTER — Ambulatory Visit (INDEPENDENT_AMBULATORY_CARE_PROVIDER_SITE_OTHER): Payer: 59 | Admitting: Cardiology

## 2013-01-13 ENCOUNTER — Encounter (INDEPENDENT_AMBULATORY_CARE_PROVIDER_SITE_OTHER): Payer: Self-pay

## 2013-01-13 ENCOUNTER — Encounter: Payer: Self-pay | Admitting: Cardiology

## 2013-01-13 VITALS — BP 124/80 | HR 70 | Ht 65.5 in | Wt 302.0 lb

## 2013-01-13 VITALS — BP 122/76 | HR 72 | Temp 97.2°F | Resp 16 | Ht 65.5 in | Wt 302.6 lb

## 2013-01-13 DIAGNOSIS — Z4651 Encounter for fitting and adjustment of gastric lap band: Secondary | ICD-10-CM

## 2013-01-13 DIAGNOSIS — I10 Essential (primary) hypertension: Secondary | ICD-10-CM

## 2013-01-13 DIAGNOSIS — G4733 Obstructive sleep apnea (adult) (pediatric): Secondary | ICD-10-CM

## 2013-01-13 DIAGNOSIS — Z6841 Body Mass Index (BMI) 40.0 and over, adult: Secondary | ICD-10-CM

## 2013-01-13 NOTE — Patient Instructions (Signed)

## 2013-01-13 NOTE — Progress Notes (Signed)
  HISTORY: Kim Brock is a 52 y.o.female who received an AP-Large lap-band in February 2014 by Dr. Andrey Campanile. She comes in with stable weight since her last visit but continues to have hunger. She denies persistent regurgitation or reflux but she is having issues with dry poultry. She is avoiding this now and is looking for alternative sources of protein.  VITAL SIGNS: Filed Vitals:   01/13/13 1203  BP: 122/76  Pulse: 72  Temp: 97.2 F (36.2 C)  Resp: 16    PHYSICAL EXAM: Physical exam reveals a very well-appearing 52 y.o.female in no apparent distress Neurologic: Awake, alert, oriented Psych: Bright affect, conversant Respiratory: Breathing even and unlabored. No stridor or wheezing Abdomen: Soft, nontender, nondistended to palpation. Incisions well-healed. No incisional hernias. Port easily palpated. Extremities: Atraumatic, good range of motion.  ASSESMENT: 52 y.o.  female  s/p AP-large lap-band.   PLAN: The patient's port was accessed with a 20G Huber needle without difficulty. Clear fluid was aspirated and 0.5 mL saline was added to the port to give a total predicted volume of 8 mL. The patient was able to swallow water without difficulty following the procedure and was instructed to take clear liquids for the next 24-48 hours and advance slowly as tolerated.

## 2013-01-13 NOTE — Patient Instructions (Signed)
Your physician recommends that you continue on your current medications as directed. Please refer to the Current Medication list given to you today.  Your physician wants you to follow-up in: 6 Months with Dr Turner You will receive a reminder letter in the mail two months in advance. If you don't receive a letter, please call our office to schedule the follow-up appointment.  

## 2013-01-13 NOTE — Progress Notes (Signed)
[address] [place], [place]  [address] Phone: [phone] Fax:  [phone]  Date:  01/13/2013   ID:  Kim Brock, DOB Jul 31, 1960, MRN [id number]  PCP:  Cala Bradford, MD  Sleep Medicine:   Armanda Magic, MD    History of Present Illness: Kim Brock is a 52 y.o. female with a history of OSA, HTN and morbid obesity who presents today for followup.  She is doing well with her CPAP therapy.  She tolerates her device well.  She tolerates her nasal pillow mask and feels the pressure is adequate on auto pressure setting.  She has no daytime sleepiness and feels rested in the am.  Her download today showed an AHI of 3.5/hr with 89% compliance in using more than 4 hours nightly.     Wt Readings from Last 3 Encounters:  01/13/13 302 lb (136.986 kg)  01/12/13 311 lb 9.6 oz (141.341 kg)  12/02/12 303 lb 6.4 oz (137.621 kg)     Past Medical History  Diagnosis Date  . Arthritis   . Hypertension   . Morbid obesity with BMI of 50.0-59.9, adult     BMI 59.3  . OSA on CPAP     on auto CPAP for life  . Diabetes mellitus without complication     diet controlled  . PONV (postoperative nausea and vomiting)     nausea likes zofran, pt has motion sickness  . Depression with anxiety   . Allergic rhinitis   . Bronchospasm     With URI  . Vitamin D deficiency   . Near sighted     Wears contacts  . Morbid obesity with BMI of 50.0-59.9, adult     Current Outpatient Prescriptions  Medication Sig Dispense Refill  . albuterol (PROVENTIL HFA;VENTOLIN HFA) 108 (90 BASE) MCG/ACT inhaler Inhale 2 puffs into the lungs every 4 (four) hours as needed for shortness of breath.       Marland Kitchen amLODipine (NORVASC) 2.5 MG tablet Take 2.5 mg by mouth every morning. Takes 5mg  daily      . Calcium Citrate-Vitamin D (CALCIUM CITRATE + PO) Take by mouth.      . clonazePAM (KLONOPIN) 0.5 MG tablet Take 0.25 mg by mouth 3 (three) times a week.       . diclofenac (VOLTAREN) 75 MG EC tablet Take  75 mg by mouth 2 (two) times daily.       Marland Kitchen escitalopram (LEXAPRO) 20 MG tablet Take 20 mg by mouth daily.      Marland Kitchen HYDROcodone-acetaminophen (NORCO/VICODIN) 5-325 MG per tablet Take 1-2 tablets by mouth every 4 (four) hours as needed.  30 tablet  0  . lisinopril-hydrochlorothiazide (PRINZIDE,ZESTORETIC) 20-12.5 MG per tablet Take 1 tablet by mouth daily.       . Loratadine (CLARITIN PO) Take by mouth.      . Multiple Vitamin (MULTIVITAMIN WITH MINERALS) TABS Take 1 tablet by mouth daily. Pt has not started yet       No current facility-administered medications for this visit.    Allergies:    Allergies  Allergen Reactions  . Chlorhexidine Dermatitis  . Demerol [Meperidine] Other (See Comments)    Blood pressure drops and makes me violently nauseous.  Marland Kitchen Epinephrine Other (See Comments)    In Novocaine - makes jittery and heart race  . Percocet [Oxycodone-Acetaminophen]     Family h/o of poor tolerance.  Will not take - can tolerate Vicodin  . Tape Other (See Comments)  Steri strips - blisters    Social History:  The patient  reports that she quit smoking about 21 years ago. Her smoking use included Cigarettes. She has a 11 pack-year smoking history. She has never used smokeless tobacco. She reports that she drinks alcohol. She reports that she does not use illicit drugs.   Family History:  The patient's family history includes Cancer in her paternal grandmother; Diabetes in her mother; Hyperlipidemia in her father; Hypertension in her mother; Osteoarthritis in her mother.   ROS:  Please see the history of present illness.      All other systems reviewed and negative.   PHYSICAL EXAM: VS:  BP 124/80  Pulse 70  Ht 5' 5.5" (1.664 m)  Wt 302 lb (136.986 kg)  BMI 49.47 kg/m2 Well nourished, well developed, in no acute distress HEENT: normal Neck: no JVD Cardiac:  normal S1, S2; RRR; no murmur Lungs:  clear to auscultation bilaterally, no wheezing, rhonchi or rales Abd: soft,  nontender, no hepatomegaly Ext: no edema Skin: warm and dry Neuro:  CNs 2-12 intact, no focal abnormalities noted       ASSESSMENT AND PLAN:  1. OSA on CPAP therapy and tolerating well on auto CPAP for life 2. HTN - well controlled  - continue Lisinopril HCT/amlodipine 3. Morbid obesity - I have counseled her to try to get into an aerobic exercise program.  Followup with me in 6 months  Signed, Armanda Magic, MD 01/13/2013 11:23 AM

## 2013-02-07 ENCOUNTER — Encounter: Payer: Self-pay | Admitting: Cardiology

## 2013-02-17 ENCOUNTER — Encounter (INDEPENDENT_AMBULATORY_CARE_PROVIDER_SITE_OTHER): Payer: Self-pay

## 2013-02-17 ENCOUNTER — Ambulatory Visit (INDEPENDENT_AMBULATORY_CARE_PROVIDER_SITE_OTHER): Payer: 59 | Admitting: Physician Assistant

## 2013-02-17 VITALS — BP 130/90 | HR 76 | Temp 98.4°F | Resp 14 | Ht 65.5 in | Wt 300.8 lb

## 2013-02-17 DIAGNOSIS — Z9884 Bariatric surgery status: Secondary | ICD-10-CM
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
## 2013-02-17 NOTE — Patient Instructions (Signed)
Return in one month. Focus on good food choices as well as physical activity. Return sooner if you have an increase in hunger, portion sizes or weight. Return also for difficulty swallowing, night cough, reflux.   

## 2013-02-18 ENCOUNTER — Encounter (INDEPENDENT_AMBULATORY_CARE_PROVIDER_SITE_OTHER): Payer: Self-pay | Admitting: General Surgery

## 2013-03-24 ENCOUNTER — Encounter (INDEPENDENT_AMBULATORY_CARE_PROVIDER_SITE_OTHER): Payer: 59

## 2013-04-07 ENCOUNTER — Encounter (INDEPENDENT_AMBULATORY_CARE_PROVIDER_SITE_OTHER): Payer: Self-pay | Admitting: Physician Assistant

## 2013-04-07 ENCOUNTER — Ambulatory Visit (INDEPENDENT_AMBULATORY_CARE_PROVIDER_SITE_OTHER): Payer: 59 | Admitting: Physician Assistant

## 2013-04-07 VITALS — BP 130/84 | HR 72 | Temp 97.8°F | Resp 20 | Ht 65.0 in | Wt 307.0 lb

## 2013-04-07 DIAGNOSIS — Z9884 Bariatric surgery status: Secondary | ICD-10-CM

## 2013-04-07 NOTE — Patient Instructions (Signed)
Return in six weeks. Focus on good food choices as well as physical activity. Return sooner if you have an increase in hunger, portion sizes or weight. Return also for difficulty swallowing, night cough, reflux.   

## 2013-04-07 NOTE — Progress Notes (Signed)
  HISTORY: Kim Brock is a 53 y.o.female who received an AP-Large lap-band in February 2014 by Dr. Andrey CampanileWilson. She comes in today with 6 lbs weight gain. She has had some psychosocial stressors over the past month that have contributed to poor eating behaviors. Fortunately she is aware of these and is working to reverse this trend. She has adopted a puppy and is walking with him three days a week, a mile each day. She is also re-focusing on nutrition. She believes a fill would cause over-restriction so she does not want one today.  VITAL SIGNS: Filed Vitals:   04/07/13 1218  BP: 130/84  Pulse: 72  Temp: 97.8 F (36.6 C)  Resp: 20    PHYSICAL EXAM: Physical exam reveals a very well-appearing 53 y.o.female in no apparent distress Neurologic: Awake, alert, oriented Psych: Bright affect, conversant Respiratory: Breathing even and unlabored. No stridor or wheezing Extremities: Atraumatic, good range of motion. Skin: Warm, Dry, no rashes Musculoskeletal: Normal gait, Joints normal  ASSESMENT: 53 y.o.  female  s/p AP-Large lap-band.   PLAN: We will watch her progress over the next several weeks and have her back in clinic in 7 weeks. She is going to re-connect with nutrition in the meantime.

## 2013-05-26 ENCOUNTER — Encounter (INDEPENDENT_AMBULATORY_CARE_PROVIDER_SITE_OTHER): Payer: 59

## 2013-06-02 ENCOUNTER — Encounter (INDEPENDENT_AMBULATORY_CARE_PROVIDER_SITE_OTHER): Payer: Self-pay

## 2013-06-02 ENCOUNTER — Ambulatory Visit (INDEPENDENT_AMBULATORY_CARE_PROVIDER_SITE_OTHER): Payer: 59 | Admitting: Physician Assistant

## 2013-06-02 VITALS — BP 126/72 | Ht 65.0 in | Wt 306.2 lb

## 2013-06-02 DIAGNOSIS — Z4651 Encounter for fitting and adjustment of gastric lap band: Secondary | ICD-10-CM

## 2013-06-02 NOTE — Progress Notes (Signed)
  HISTORY: Kim Brock is a 53 y.o.female who received an AP-Large lap-band in February 2014 by Dr. Andrey CampanileWilson. She comes in with stable weight since her last visit. She believes she needs a fill today. She is having no untoward symptoms including regurgitation or reflux.  VITAL SIGNS: Filed Vitals:   06/02/13 1209  BP: 126/72    PHYSICAL EXAM: Physical exam reveals a very well-appearing 53 y.o.female in no apparent distress Neurologic: Awake, alert, oriented Psych: Bright affect, conversant Respiratory: Breathing even and unlabored. No stridor or wheezing Abdomen: Soft, nontender, nondistended to palpation. Incisions well-healed. No incisional hernias. Port easily palpated. Extremities: Atraumatic, good range of motion.  ASSESMENT: 53 y.o.  female  s/p AP-Large lap-band.   PLAN: The patient's port was accessed with a 20G Huber needle without difficulty. Clear fluid was aspirated and 0.5 mL saline was added to the port to give a total predicted volume of 8.5 mL. The patient was able to swallow water without difficulty following the procedure and was instructed to take clear liquids for the next 24-48 hours and advance slowly as tolerated.

## 2013-06-02 NOTE — Patient Instructions (Signed)

## 2013-07-07 ENCOUNTER — Encounter (INDEPENDENT_AMBULATORY_CARE_PROVIDER_SITE_OTHER): Payer: Self-pay

## 2013-07-07 ENCOUNTER — Ambulatory Visit (INDEPENDENT_AMBULATORY_CARE_PROVIDER_SITE_OTHER): Payer: 59 | Admitting: Physician Assistant

## 2013-07-07 DIAGNOSIS — Z9884 Bariatric surgery status: Secondary | ICD-10-CM

## 2013-07-07 NOTE — Progress Notes (Signed)
  HISTORY: Kim Brock is a 53 y.o.female who received an AP-Large lap-band in February 2014 by Dr. Andrey Campanile. She comes in with 2 lbs weight loss since her last visit in April. She reports being more cognizant of when she feels full and when to stop her meal. She does report being able to work in her yard more but is still not getting in enough physical activity. Overall her food choices are good and her portion sizes remain small.  VITAL SIGNS: Filed Vitals:   07/07/13 1218  BP: 138/86  Pulse: 72  Temp: 98.3 F (36.8 C)  Resp: 14    PHYSICAL EXAM: Physical exam reveals a very well-appearing 54 y.o.female in no apparent distress Neurologic: Awake, alert, oriented Psych: Bright affect, conversant Respiratory: Breathing even and unlabored. No stridor or wheezing Extremities: Atraumatic, good range of motion. Skin: Warm, Dry, no rashes Musculoskeletal: Normal gait, Joints normal  ASSESMENT: 53 y.o.  female  s/p AP-Large lap-band.   PLAN: We talked at length regarding whether to do a fill. It appears that overall her hunger and portion sizes are under good control. Further adjustment may cause over-restriction. Smaller portions may not be helpful either. I encouraged her to increase her physical activity and to continue to make wise food choices. We'll have her back in six weeks or sooner if needed.

## 2013-07-07 NOTE — Patient Instructions (Signed)
Return in six weeks. Focus on good food choices as well as physical activity. Return sooner if you have an increase in hunger, portion sizes or weight. Return also for difficulty swallowing, night cough, reflux.   

## 2013-08-25 ENCOUNTER — Encounter (INDEPENDENT_AMBULATORY_CARE_PROVIDER_SITE_OTHER): Payer: Self-pay

## 2013-08-25 ENCOUNTER — Ambulatory Visit (INDEPENDENT_AMBULATORY_CARE_PROVIDER_SITE_OTHER): Payer: 59 | Admitting: Physician Assistant

## 2013-08-25 VITALS — BP 136/82 | HR 96 | Ht 65.0 in | Wt 302.2 lb

## 2013-08-25 DIAGNOSIS — Z4651 Encounter for fitting and adjustment of gastric lap band: Secondary | ICD-10-CM

## 2013-08-25 NOTE — Progress Notes (Signed)
  HISTORY: Kim Brock is a 53 y.o.female who received an AP-Large lap-band in February 2014 by Dr. Andrey CampanileWilson. The patient has lost two lbs since their last visit in June, and has lost 58 lbs since surgery. She comes in with some increased hunger and larger than desired portion sizes. She is having no regurgitation or reflux symptoms other than when she eats too quickly or chews inadequately. Due to some recent dental issues, she has been on soft foods and liquids but she is slowly advancing. She continues to not be very physically active.  VITAL SIGNS: Filed Vitals:   08/25/13 0831  BP: 136/82  Pulse: 96    PHYSICAL EXAM: Physical exam reveals a very well-appearing 53 y.o.female in no apparent distress Neurologic: Awake, alert, oriented Psych: Bright affect, conversant Respiratory: Breathing even and unlabored. No stridor or wheezing Abdomen: Soft, nontender, nondistended to palpation. Incisions well-healed. No incisional hernias. Port easily palpated. Extremities: Atraumatic, good range of motion.  ASSESMENT: 53 y.o.  female  s/p AP-Large lap-band.   PLAN: The patient's port was accessed with a 20G Huber needle without difficulty. Clear fluid was aspirated and 0.5 mL saline was added to the port to give a total predicted volume of 9 mL. The patient was able to swallow water without difficulty following the procedure and was instructed to take clear liquids for the next 24-48 hours and advance slowly as tolerated. I encouraged her again to increase her physical activity. We'll have her back in two months or sooner if needd.

## 2013-08-25 NOTE — Patient Instructions (Signed)

## 2013-09-07 ENCOUNTER — Telehealth (INDEPENDENT_AMBULATORY_CARE_PROVIDER_SITE_OTHER): Payer: Self-pay | Admitting: *Deleted

## 2013-09-07 NOTE — Telephone Encounter (Signed)
LMOM for pt to return my call regarding her Lapband Fill scheduled for 10-27-13.  The appt had to be cancelled due to provider out of office.  Just need to reschedule for week after or whenever is best for pt.  Thanks!  Victorino DikeJennifer

## 2013-09-28 ENCOUNTER — Other Ambulatory Visit: Payer: Self-pay | Admitting: Gastroenterology

## 2013-10-16 ENCOUNTER — Encounter: Payer: Self-pay | Admitting: *Deleted

## 2013-10-27 ENCOUNTER — Encounter (INDEPENDENT_AMBULATORY_CARE_PROVIDER_SITE_OTHER): Payer: 59

## 2013-11-03 ENCOUNTER — Encounter (INDEPENDENT_AMBULATORY_CARE_PROVIDER_SITE_OTHER): Payer: 59

## 2013-11-06 ENCOUNTER — Encounter (HOSPITAL_COMMUNITY): Payer: Self-pay | Admitting: Emergency Medicine

## 2013-11-06 ENCOUNTER — Emergency Department (INDEPENDENT_AMBULATORY_CARE_PROVIDER_SITE_OTHER)
Admission: EM | Admit: 2013-11-06 | Discharge: 2013-11-06 | Disposition: A | Discharge status: Home / Self Care | Payer: 59 | Source: Home / Self Care | Attending: Family Medicine | Admitting: Family Medicine

## 2013-11-06 DIAGNOSIS — S0501XA Injury of conjunctiva and corneal abrasion without foreign body, right eye, initial encounter: Secondary | ICD-10-CM

## 2013-11-06 MED ORDER — TOBRAMYCIN 0.3 % OP OINT
TOPICAL_OINTMENT | OPHTHALMIC | Status: AC
  Filled 2013-11-06: qty 3.5

## 2013-11-06 MED ORDER — TETRACAINE HCL 0.5 % OP SOLN
OPHTHALMIC | Status: AC
  Filled 2013-11-06: qty 2

## 2013-11-06 MED ORDER — TOBRAMYCIN 0.3 % OP OINT: TOPICAL_OINTMENT | Freq: Four times a day (QID) | OPHTHALMIC | Status: DC

## 2013-11-06 MED ORDER — CYCLOPENTOLATE HCL 1 % OP SOLN
1.0000 [drp] | Freq: Once | OPHTHALMIC | Status: DC
Start: 2013-11-06 — End: 2013-11-06
  Filled 2013-11-06: qty 2

## 2013-11-06 NOTE — ED Notes (Signed)
Patient c/o of dog scratch to right eye onset this morning. Patient is holding ice pack up to eye and is "unable to open her eyes" Patient reports dog is up to date on his shots. Patient is alert and oriented and in NAD.

## 2013-11-06 NOTE — ED Provider Notes (Addendum)
CSN: [id number]     Arrival date & time 11/06/13  0913 History   First MD Initiated Contact with Patient 11/06/13 [phone]     Chief Complaint  Patient presents with  . Facial Laceration   (Consider location/radiation/quality/duration/timing/severity/associated sxs/prior Treatment) Patient is a 53 y.o. female presenting with eye injury. The history is provided by the patient.  Eye Injury This is a new problem. The current episode started 1 to 2 hours ago (right eye scratched this am by dog in bed. c/o pain.). The problem has not changed since onset.   Past Medical History  Diagnosis Date  . Arthritis   . Hypertension   . Morbid obesity with BMI of 50.0-59.9, adult     BMI 59.3  . OSA on CPAP     on auto CPAP for life  . Diabetes mellitus without complication     diet controlled  . PONV (postoperative nausea and vomiting)     nausea likes zofran, pt has motion sickness  . Depression with anxiety   . Allergic rhinitis   . Bronchospasm     With URI  . Vitamin D deficiency   . Near sighted     Wears contacts  . Morbid obesity with BMI of 50.0-59.9, adult    Past Surgical History  Procedure Laterality Date  . Knee surgery  2009/2010    left and right knee  . Dilation and curettage of uterus  2011  . Cholecystectomy  1999  . Tubal ligation  2001  . Laparoscopic gastric banding N/A 03/30/2012    Procedure: LAPAROSCOPIC GASTRIC BANDING;  Surgeon: Atilano InaEric M Wilson, MD,FACS;  Location: [facility];  Service: General;  Laterality: N/A;  Laparoscopic Adjustable Gastric Band Placement,   . Mesh applied to lap port N/A 03/30/2012    Procedure: MESH APPLIED TO LAP PORT;  Surgeon: Atilano InaEric M Wilson, MD,FACS;  Location: [facility];  Service: General;  Laterality: N/A;   Family History  Problem Relation Age of Onset  . Diabetes Mother   . Hypertension Mother   . Osteoarthritis Mother   . Hyperlipidemia Father   . Cancer Paternal Grandmother     Colon   History  Substance Use Topics  . Smoking  status: Former Smoker -- 1.00 packs/day for 11 years    Types: Cigarettes    Quit date: 08/06/1991  . Smokeless tobacco: Never Used  . Alcohol Use: 0.0 oz/week    1-2 Glasses of wine per week     Comment: 1-2 drinks    OB History   Grav Para Term Preterm Abortions TAB SAB Ect Mult Living                 Review of Systems  Constitutional: Negative.   HENT: Negative.   Eyes: Positive for photophobia, pain and redness. Negative for visual disturbance.    Allergies  Chlorhexidine; Demerol; Epinephrine; Percocet; and Tape  Home Medications   Prior to Admission medications   Medication Sig Start Date End Date Taking? Authorizing Provider  albuterol (PROVENTIL HFA;VENTOLIN HFA) 108 (90 BASE) MCG/ACT inhaler Inhale 2 puffs into the lungs every 4 (four) hours as needed for shortness of breath.     Historical Provider, MD  amLODipine (NORVASC) 5 MG tablet Take 5 mg by mouth daily.    Historical Provider, MD  Calcium Citrate-Vitamin D (CALCIUM CITRATE + PO) Take by mouth.    Historical Provider, MD  clonazePAM (KLONOPIN) 0.5 MG tablet Take 0.25 mg by mouth 3 (three) times a week.  Historical Provider, MD  diclofenac (VOLTAREN) 75 MG EC tablet Take 75 mg by mouth 2 (two) times daily.  05/03/12   Historical Provider, MD  escitalopram (LEXAPRO) 20 MG tablet Take 20 mg by mouth daily.    Historical Provider, MD  HYDROcodone-acetaminophen (NORCO/VICODIN) 5-325 MG per tablet Take 1-2 tablets by mouth every 4 (four) hours as needed. 03/31/12   Atilano Ina, MD  lisinopril-hydrochlorothiazide (PRINZIDE,ZESTORETIC) 20-12.5 MG per tablet Take 1 tablet by mouth daily.  12/03/11   Historical Provider, MD  Loratadine (CLARITIN PO) Take by mouth.    Historical Provider, MD  Multiple Vitamin (MULTIVITAMIN WITH MINERALS) TABS Take 1 tablet by mouth daily. Pt has not started yet    Historical Provider, MD   BP 148/95  Pulse 85  Resp 16  SpO2 96% Physical Exam  Nursing note and vitals  reviewed. Constitutional: She appears well-developed and well-nourished. She appears distressed.  HENT:  Head: Normocephalic.  Eyes: EOM and lids are normal. Pupils are equal, round, and reactive to light. Lids are everted and swept, no foreign bodies found. No foreign body present in the right eye. Right conjunctiva is not injected. Right conjunctiva has no hemorrhage.  Slit lamp exam:      The right eye shows corneal abrasion and fluorescein uptake. The right eye shows no foreign body, no hyphema and no anterior chamber bulge.    Neck: Normal range of motion. Neck supple.  Skin: Skin is warm and dry.    ED Course  Procedures (including critical care time) Labs Review Labs Reviewed - No data to display  Imaging Review No results found.   MDM   1. Corneal abrasion, right, initial encounter        Linna Hoff, MD 11/06/13 1000  Linna Hoff, MD 11/08/13 1147

## 2013-11-06 NOTE — Discharge Instructions (Signed)
Use eye medicine as prescribed, see your eye doctor if further problems, no contact for 10 days.

## 2014-02-22 IMAGING — CR DG ABDOMEN 1V
1 series · 1 of 1 positions shown · non-contrast
Comparison: None.

CLINICAL DATA: Postop laparoscopic gastric band placement.

ABDOMEN - 1 VIEW

[t abdomen supine *]
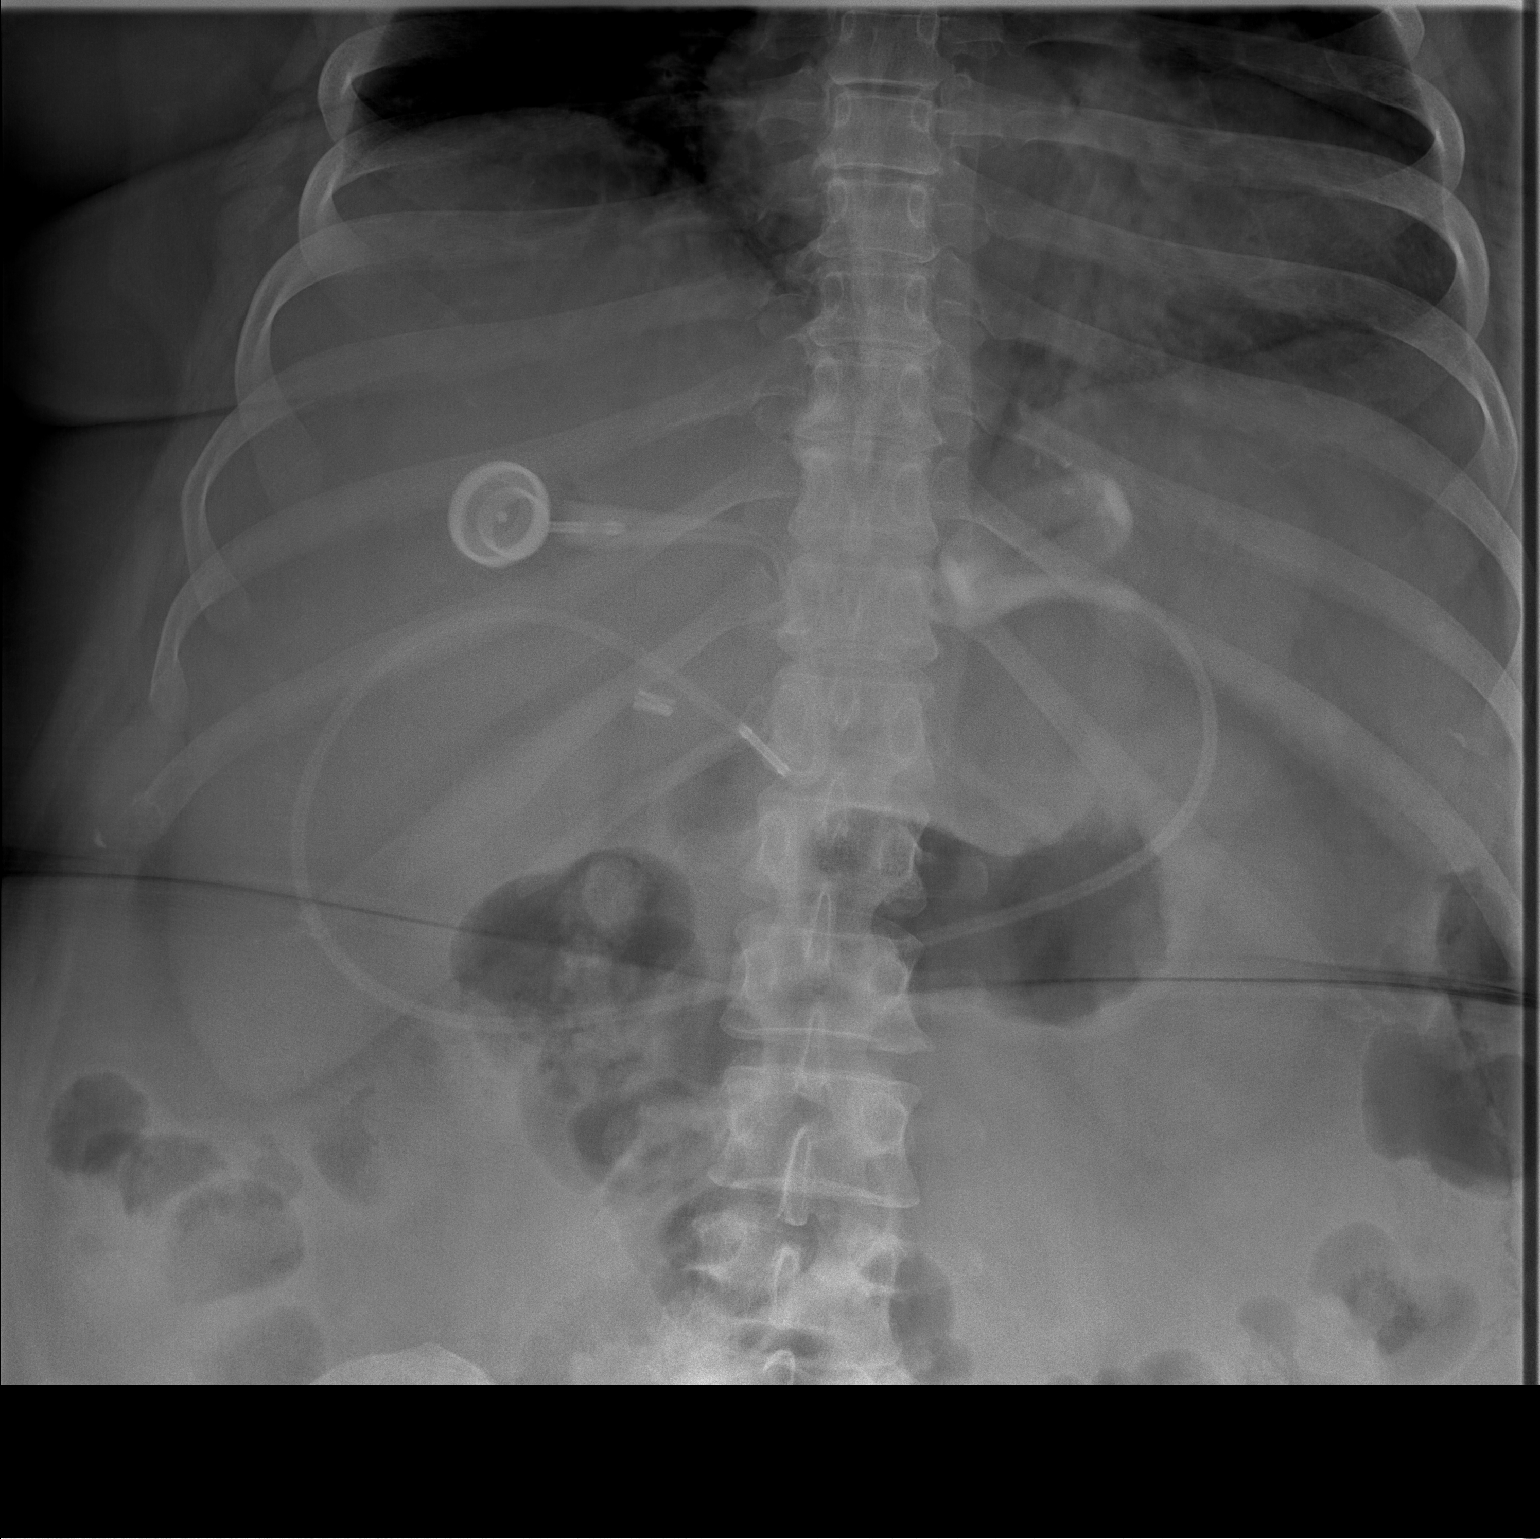

[1 of 1 positions shown; findings below may reference images not displayed]

FINDINGS: A laparoscopic gastric band is seen in the epigastric
region, at the 2 o'clock and 8 o'clock positions.  The port
projects over the right upper quadrant.  Bowel gas pattern is
unremarkable.
IMPRESSION: Laparoscopic gastric band appears in appropriate position.

## 2014-07-21 ENCOUNTER — Other Ambulatory Visit: Payer: Self-pay

## 2014-07-21 DIAGNOSIS — Z1231 Encounter for screening mammogram for malignant neoplasm of breast: Secondary | ICD-10-CM

## 2014-07-26 ENCOUNTER — Ambulatory Visit
Admission: RE | Admit: 2014-07-26 | Discharge: 2014-07-26 | Disposition: A | Discharge status: Home / Self Care | Payer: 59 | Source: Ambulatory Visit

## 2014-07-26 DIAGNOSIS — Z1231 Encounter for screening mammogram for malignant neoplasm of breast: Secondary | ICD-10-CM

## 2014-07-27 ENCOUNTER — Other Ambulatory Visit: Payer: Self-pay | Admitting: Obstetrics & Gynecology

## 2014-07-27 DIAGNOSIS — R928 Other abnormal and inconclusive findings on diagnostic imaging of breast: Secondary | ICD-10-CM

## 2014-07-31 ENCOUNTER — Other Ambulatory Visit: Payer: Self-pay

## 2014-08-11 ENCOUNTER — Other Ambulatory Visit: Payer: 59

## 2014-09-27 ENCOUNTER — Other Ambulatory Visit: Payer: Self-pay | Admitting: Obstetrics & Gynecology

## 2014-09-28 LAB — CYTOLOGY - PAP

## 2014-10-19 ENCOUNTER — Other Ambulatory Visit: Payer: 59

## 2014-10-26 ENCOUNTER — Ambulatory Visit
Admission: RE | Admit: 2014-10-26 | Discharge: 2014-10-26 | Disposition: A | Discharge status: Home / Self Care | Payer: 59 | Source: Ambulatory Visit | Attending: Obstetrics & Gynecology | Admitting: Obstetrics & Gynecology

## 2014-10-26 ENCOUNTER — Other Ambulatory Visit: Payer: Self-pay | Admitting: Obstetrics & Gynecology

## 2014-10-26 DIAGNOSIS — R928 Other abnormal and inconclusive findings on diagnostic imaging of breast: Secondary | ICD-10-CM

## 2014-11-06 ENCOUNTER — Ambulatory Visit
Admission: RE | Admit: 2014-11-06 | Discharge: 2014-11-06 | Disposition: A | Discharge status: Home / Self Care | Payer: 59 | Source: Ambulatory Visit | Attending: Obstetrics & Gynecology | Admitting: Obstetrics & Gynecology

## 2014-11-06 ENCOUNTER — Other Ambulatory Visit: Payer: Self-pay | Admitting: Obstetrics & Gynecology

## 2014-11-06 DIAGNOSIS — R928 Other abnormal and inconclusive findings on diagnostic imaging of breast: Secondary | ICD-10-CM

## 2015-04-18 ENCOUNTER — Other Ambulatory Visit: Payer: Self-pay | Admitting: Obstetrics & Gynecology

## 2015-04-18 DIAGNOSIS — N6320 Unspecified lump in the left breast, unspecified quadrant: Secondary | ICD-10-CM

## 2015-05-07 ENCOUNTER — Ambulatory Visit
Admission: RE | Admit: 2015-05-07 | Discharge: 2015-05-07 | Disposition: A | Discharge status: Home / Self Care | Payer: 59 | Source: Ambulatory Visit | Attending: Obstetrics & Gynecology | Admitting: Obstetrics & Gynecology

## 2015-05-07 DIAGNOSIS — N6320 Unspecified lump in the left breast, unspecified quadrant: Secondary | ICD-10-CM

## 2015-08-09 ENCOUNTER — Ambulatory Visit (INDEPENDENT_AMBULATORY_CARE_PROVIDER_SITE_OTHER): Payer: 59 | Admitting: Cardiology

## 2015-08-09 ENCOUNTER — Encounter: Payer: Self-pay | Admitting: Cardiology

## 2015-08-09 VITALS — BP 114/76 | HR 76 | Ht 64.5 in | Wt 313.0 lb

## 2015-08-09 DIAGNOSIS — Z6841 Body Mass Index (BMI) 40.0 and over, adult: Secondary | ICD-10-CM

## 2015-08-09 DIAGNOSIS — Z9989 Dependence on other enabling machines and devices: Principal | ICD-10-CM

## 2015-08-09 DIAGNOSIS — G4733 Obstructive sleep apnea (adult) (pediatric): Secondary | ICD-10-CM

## 2015-08-09 DIAGNOSIS — I10 Essential (primary) hypertension: Secondary | ICD-10-CM | POA: Diagnosis not present

## 2015-08-09 NOTE — Patient Instructions (Signed)

## 2015-08-09 NOTE — Progress Notes (Signed)
Cardiology Office Note    Date:  08/09/2015   ID:  Kim Brock, DOB May 19, 1960, MRN [id number]  PCP:  Kim BradfordWHITE,CYNTHIA S, MD  Cardiologist:  Kim Magicraci Erasmus Bistline, MD   Chief Complaint  Patient presents with  . Sleep Apnea  . Hypertension    History of Present Illness:  Kim Brock is a 55 y.o. female with a history of OSA, HTN and morbid obesity who presents today for followup. She is doing well with her CPAP therapy. She tolerates her device well. She tolerates her nasal pillow mask and feels the pressure is adequate on auto pressure setting. She has no daytime sleepiness and feels rested in the am.   Past Medical History  Diagnosis Date  . Arthritis   . Hypertension   . Morbid obesity with BMI of 50.0-59.9, adult (HCC)     BMI 59.3  . OSA on CPAP     on auto CPAP for life  . Diabetes mellitus without complication (HCC)     diet controlled  . PONV (postoperative nausea and vomiting)     nausea likes zofran, pt has motion sickness  . Depression with anxiety   . Allergic rhinitis   . Bronchospasm     With URI  . Vitamin D deficiency   . Near sighted     Wears contacts  . Morbid obesity with BMI of 50.0-59.9, adult [facility](HCC)     Past Surgical History  Procedure Laterality Date  . Knee surgery  2009/2010    left and right knee  . Dilation and curettage of uterus  2011  . Cholecystectomy  1999  . Tubal ligation  2001  . Laparoscopic gastric banding N/A 03/30/2012    Procedure: LAPAROSCOPIC GASTRIC BANDING;  Surgeon: Atilano InaEric M Wilson, MD,FACS;  Location: [facility];  Service: General;  Laterality: N/A;  Laparoscopic Adjustable Gastric Band Placement,   . Mesh applied to lap port N/A 03/30/2012    Procedure: MESH APPLIED TO LAP PORT;  Surgeon: Atilano InaEric M Wilson, MD,FACS;  Location: [facility];  Service: General;  Laterality: N/A;    Current Medications: Outpatient Prescriptions Prior to Visit  Medication Sig Dispense Refill  . albuterol (PROVENTIL HFA;VENTOLIN HFA) 108 (90 BASE)  MCG/ACT inhaler Inhale 2 puffs into the lungs every 4 (four) hours as needed for shortness of breath.     Marland Kitchen. amLODipine (NORVASC) 5 MG tablet Take 5 mg by mouth daily.    . Calcium Citrate-Vitamin D (CALCIUM CITRATE + PO) Take by mouth.    . clonazePAM (KLONOPIN) 0.5 MG tablet Take 0.25 mg by mouth 3 (three) times a week.     . diclofenac (VOLTAREN) 75 MG EC tablet Take 75 mg by mouth 2 (two) times daily.     Marland Kitchen. HYDROcodone-acetaminophen (NORCO/VICODIN) 5-325 MG per tablet Take 1-2 tablets by mouth every 4 (four) hours as needed. 30 tablet 0  . lisinopril-hydrochlorothiazide (PRINZIDE,ZESTORETIC) 20-12.5 MG per tablet Take 1 tablet by mouth daily.     . Loratadine (CLARITIN PO) Take by mouth.    . Multiple Vitamin (MULTIVITAMIN WITH MINERALS) TABS Take 1 tablet by mouth daily.     Marland Kitchen. escitalopram (LEXAPRO) 20 MG tablet Take 20 mg by mouth daily.     No facility-administered medications prior to visit.     Allergies:   Chlorhexidine; Demerol; Epinephrine; Percocet; and Tape   Social History   Social History  . Marital Status: Single    Spouse Name: N/A  . Number of Children: N/A  .  Years of Education: N/A   Social History Main Topics  . Smoking status: Former Smoker -- 1.00 packs/day for 11 years    Types: Cigarettes    Quit date: 08/06/1991  . Smokeless tobacco: Never Used  . Alcohol Use: 0.0 oz/week    1-2 Glasses of wine per week     Comment: 1-2 drinks   . Drug Use: No  . Sexual Activity: Not Asked   Other Topics Concern  . None   Social History Narrative     Family History:  The patient'Brock family history includes Cancer in her paternal grandmother; Diabetes in her mother; Hyperlipidemia in her father; Hypertension in her mother; Osteoarthritis in her mother.   ROS:   Please see the history of present illness.    Review of Systems  Psychiatric/Behavioral: Positive for depression. The patient is nervous/anxious.    All other systems reviewed and are  negative.   PHYSICAL EXAM:   VS:  BP 114/76 mmHg  Pulse 76  Ht 5' 4.5" (1.638 m)  Wt 313 lb (141.976 kg)  BMI 52.92 kg/m2   GEN: Well nourished, well developed, in no acute distress HEENT: normal Neck: no JVD, carotid bruits, or masses Cardiac: RRR; no murmurs, rubs, or gallops,no edema.  Intact distal pulses bilaterally.  Respiratory:  clear to auscultation bilaterally, normal work of breathing GI: soft, nontender, nondistended, + BS MS: no deformity or atrophy Skin: warm and dry, no rash Neuro:  Alert and Oriented x 3, Strength and sensation are intact Psych: euthymic mood, full affect  Wt Readings from Last 3 Encounters:  08/09/15 313 lb (141.976 kg)  08/25/13 302 lb 3.2 oz (137.077 kg)  07/07/13 304 lb 3.2 oz (137.984 kg)      Studies/Labs Reviewed:   EKG:  EKG is not ordered today.    Recent Labs: No results found for requested labs within last 365 days.   Lipid Panel    Component Value Date/Time   CHOL 164 01/06/2012 0925   TRIG 157* 01/06/2012 0925   HDL 62 01/06/2012 0925   CHOLHDL 2.6 01/06/2012 0925   VLDL 31 01/06/2012 0925   LDLCALC 71 01/06/2012 0925    Additional studies/ records that were reviewed today include:  None    ASSESSMENT:    1. OSA on CPAP   2. Essential hypertension   3. Morbid obesity with BMI of 50.0-59.9, adult (HCC)      PLAN:  In order of problems listed above:  OSA - the patient is tolerating PAP therapy well without any problems.  The patient has been using and benefiting from CPAP use and will continue to benefit from therapy.  HTN- BP controlled.  Continue amlodipine/Prinizde Obesity - I have encouraged him to get into a routine exercise program and cut back on carbs and portions. Her exercise is limited by knee pain    Medication Adjustments/Labs and Tests Ordered: Current medicines are reviewed at length with the patient today.  Concerns regarding medicines are outlined above.  Medication changes, Labs and Tests  ordered today are listed in the Patient Instructions below.  There are no Patient Instructions on file for this visit.   Signed, Kim Magicraci Lamberto Dinapoli, MD  08/09/2015 8:43 AM    [facility] [address], [place], [place]  [address] Phone: [phone]; Fax: [phone]

## 2015-08-13 ENCOUNTER — Other Ambulatory Visit: Payer: Self-pay | Admitting: Obstetrics & Gynecology

## 2015-08-13 DIAGNOSIS — Z1231 Encounter for screening mammogram for malignant neoplasm of breast: Secondary | ICD-10-CM

## 2015-08-15 ENCOUNTER — Ambulatory Visit: Payer: 59

## 2016-02-24 DIAGNOSIS — J45909 Unspecified asthma, uncomplicated: Secondary | ICD-10-CM | POA: Diagnosis not present

## 2016-02-27 DIAGNOSIS — E119 Type 2 diabetes mellitus without complications: Secondary | ICD-10-CM | POA: Diagnosis not present

## 2016-02-29 ENCOUNTER — Ambulatory Visit
Admission: RE | Admit: 2016-02-29 | Discharge: 2016-02-29 | Disposition: A | Discharge status: Home / Self Care | Payer: 59 | Source: Ambulatory Visit | Attending: Family Medicine | Admitting: Family Medicine

## 2016-02-29 ENCOUNTER — Other Ambulatory Visit: Payer: Self-pay | Admitting: Family Medicine

## 2016-02-29 DIAGNOSIS — R059 Cough, unspecified: Secondary | ICD-10-CM

## 2016-02-29 DIAGNOSIS — R05 Cough: Secondary | ICD-10-CM

## 2016-02-29 DIAGNOSIS — R0602 Shortness of breath: Secondary | ICD-10-CM | POA: Diagnosis not present

## 2016-02-29 DIAGNOSIS — J45909 Unspecified asthma, uncomplicated: Secondary | ICD-10-CM | POA: Diagnosis not present

## 2016-03-07 DIAGNOSIS — R609 Edema, unspecified: Secondary | ICD-10-CM | POA: Diagnosis not present

## 2016-03-07 DIAGNOSIS — J4531 Mild persistent asthma with (acute) exacerbation: Secondary | ICD-10-CM | POA: Diagnosis not present

## 2016-07-04 DIAGNOSIS — M1712 Unilateral primary osteoarthritis, left knee: Secondary | ICD-10-CM | POA: Diagnosis not present

## 2016-07-04 DIAGNOSIS — M25562 Pain in left knee: Secondary | ICD-10-CM | POA: Diagnosis not present

## 2016-07-09 ENCOUNTER — Encounter (HOSPITAL_COMMUNITY): Payer: Self-pay | Admitting: *Deleted

## 2016-07-09 DIAGNOSIS — M1712 Unilateral primary osteoarthritis, left knee: Secondary | ICD-10-CM | POA: Diagnosis not present

## 2016-07-10 ENCOUNTER — Other Ambulatory Visit (HOSPITAL_COMMUNITY): Payer: Self-pay | Admitting: *Deleted

## 2016-07-10 NOTE — Patient Instructions (Addendum)
Kim KaysSheila N Brock  07/10/2016   Your procedure is scheduled on: 07/14/2016   [facility]  En[facility] .  Report to admitting at    130pm   Call this number if you have problems the morning of surgery [phone]   Remember: ONLY 1 PERSON MAY GO WITH YOU TO SHORT STAY TO GET  READY MORNING OF YOUR SURGERY.  Do not eat food after midnite. May have clear liquids from 12 midnite until 0930am morning of surgery then nothing by mouth.      Take these medicines the morning of surgery with A SIP OF WATER: Albuterol Inhaler if needed and bring, Qvar Inhaler and bring, Amlodipine ( NORvasc), Lexapro, Claritin if needed, Hydrocodone if needed                                 You may not have any metal on your body including hair pins and              piercings  Do not wear jewelry, make-up, lotions, powders or perfumes, deodorant             Do not wear nail polish.  Do not shave  48 hours prior to surgery.                Do not bring valuables to the hospital. [facility] IS NOT             RESPONSIBLE   FOR VALUABLES.  Contacts, dentures or bridgework may not be worn into surgery.  Leave suitcase in the car. After surgery it may be brought to your room.                     Please read over the following fact sheets you were given: _____________________________________________________________________                CLEAR LIQUID DIET   Foods Allowed                                                                     Foods Excluded  Coffee and tea, regular and decaf                             liquids that you cannot  Plain Jell-O in any flavor                                             see through such as: Fruit ices (not with fruit pulp)                                     milk, soups, orange juice  Iced Popsicles  All solid food Carbonated beverages, regular and diet                                     Cranberry, grape and apple juices Sports drinks like Gatorade Lightly seasoned clear broth or consume(fat free) Sugar, honey syrup  Sample Menu Breakfast                                Lunch                                     Supper Cranberry juice                    Beef broth                            Chicken broth Jell-O                                     Grape juice                           Apple juice Coffee or tea                        Jell-O                                      Popsicle                                                Coffee or tea                        Coffee or tea  _____________________________________________________________________  [facility] - Preparing for Surgery Before surgery, you can play an important role.  Because skin is not sterile, your skin needs to be as free of germs as possible.  You can reduce the number of germs on your skin by washing withDIAL soap before surgery.  CHG is an antiseptic cleaner which kills germs and bonds with the skin to continue killing germs even after washing. Please DO NOT use if you have an allergy to CHG or antibacterial soaps.  If your skin becomes reddened/irritated stop using the CHG and inform your nurse when you arrive at Short Stay. Do not shave (including legs and underarms) for at least 48 hours prior to the first DIALshower.  You may shave your face/neck. Please follow these instructions carefully:  1.  Shower with DIAL the night before surgery and the  morning of Surgery.  2.  If you choose to wash your hair, wash your hair first as usual with your  normal  shampoo.  3.  After you shampoo, rinse your hair and body thoroughly to remove the  shampoo.  4.  Use DIAL as you would any other liquid soap.  You can apply chg directly  to the skin and wash                       Gently with a scrungie or clean washcloth.  5.  Apply the  DIAL Soap to your body ONLY FROM THE NECK DOWN.   Do not  use on face/ open                           Wound or open sores. Avoid contact with eyes, ears mouth and genitals (private parts).                       Wash face,  Genitals (private parts) with your normal soap.             6.  Wash thoroughly, paying special attention to the area where your surgery  will be performed.  7.  Thoroughly rinse your body with warm water from the neck down.  8.  DO NOT shower/wash with your normal soap after using and rinsing off  the CHG Soap.                9.  Pat yourself dry with a clean towel.            10.  Wear clean pajamas.            11.  Place clean sheets on your bed the night of your first shower and do not  sleep with pets. Day of Surgery : Do not apply any lotions/deodorants the morning of surgery.  Please wear clean clothes to the hospital/surgery center.  FAILURE TO FOLLOW THESE INSTRUCTIONS MAY RESULT IN THE CANCELLATION OF YOUR SURGERY PATIENT SIGNATURE_________________________________  NURSE SIGNATURE__________________________________  ________________________________________________________________________  WHAT IS A BLOOD TRANSFUSION? Blood Transfusion Information  A transfusion is the replacement of blood or some of its parts. Blood is made up of multiple cells which provide different functions.  Red blood cells carry oxygen and are used for blood loss replacement.  White blood cells fight against infection.  Platelets control bleeding.  Plasma helps clot blood.  Other blood products are available for specialized needs, such as hemophilia or other clotting disorders. BEFORE THE TRANSFUSION  Who gives blood for transfusions?   Healthy volunteers who are fully evaluated to make sure their blood is safe. This is blood bank blood. Transfusion therapy is the safest it has ever been in the practice of medicine. Before blood is taken from a donor, a complete history is taken to make sure that person has no history of diseases nor  engages in risky social behavior (examples are intravenous drug use or sexual activity with multiple partners). The donor's travel history is screened to minimize risk of transmitting infections, such as malaria. The donated blood is tested for signs of infectious diseases, such as HIV and hepatitis. The blood is then tested to be sure it is compatible with you in order to minimize the chance of a transfusion reaction. If you or a relative donates blood, this is often done in anticipation of surgery and is not appropriate for emergency situations. It takes many days to process the donated blood. RISKS AND COMPLICATIONS Although transfusion therapy is very safe and saves many lives, the main dangers of transfusion include:   Getting an infectious disease.  Developing a transfusion  reaction. This is an allergic reaction to something in the blood you were given. Every precaution is taken to prevent this. The decision to have a blood transfusion has been considered carefully by your caregiver before blood is given. Blood is not given unless the benefits outweigh the risks. AFTER THE TRANSFUSION  Right after receiving a blood transfusion, you will usually feel much better and more energetic. This is especially true if your red blood cells have gotten low (anemic). The transfusion raises the level of the red blood cells which carry oxygen, and this usually causes an energy increase.  The nurse administering the transfusion will monitor you carefully for complications. HOME CARE INSTRUCTIONS  No special instructions are needed after a transfusion. You may find your energy is better. Speak with your caregiver about any limitations on activity for underlying diseases you may have. SEEK MEDICAL CARE IF:   Your condition is not improving after your transfusion.  You develop redness or irritation at the intravenous (IV) site. SEEK IMMEDIATE MEDICAL CARE IF:  Any of the following symptoms occur over the next  12 hours:  Shaking chills.  You have a temperature by mouth above 102 F (38.9 C), not controlled by medicine.  Chest, back, or muscle pain.  People around you feel you are not acting correctly or are confused.  Shortness of breath or difficulty breathing.  Dizziness and fainting.  You get a rash or develop hives.  You have a decrease in urine output.  Your urine turns a dark color or changes to pink, red, or brown. Any of the following symptoms occur over the next 10 days:  You have a temperature by mouth above 102 F (38.9 C), not controlled by medicine.  Shortness of breath.  Weakness after normal activity.  The white part of the eye turns yellow (jaundice).  You have a decrease in the amount of urine or are urinating less often.  Your urine turns a dark color or changes to pink, red, or brown. Document Released: 01/18/2000 Document Revised: 04/14/2011 Document Reviewed: 09/06/2007 [employer] Patient Information 2014 [place], [place].  _______________________________________________________________________  Incentive Spirometer  An incentive spirometer is a tool that can help keep your lungs clear and active. This tool measures how well you are filling your lungs with each breath. Taking long deep breaths may help reverse or decrease the chance of developing breathing (pulmonary) problems (especially infection) following:  A long period of time when you are unable to move or be active. BEFORE THE PROCEDURE   If the spirometer includes an indicator to show your best effort, your nurse or respiratory therapist will set it to a desired goal.  If possible, sit up straight or lean slightly forward. Try not to slouch.  Hold the incentive spirometer in an upright position. INSTRUCTIONS FOR USE  1. Sit on the edge of your bed if possible, or sit up as far as you can in bed or on a chair. 2. Hold the incentive spirometer in an upright position. 3. Breathe out  normally. 4. Place the mouthpiece in your mouth and seal your lips tightly around it. 5. Breathe in slowly and as deeply as possible, raising the piston or the ball toward the top of the column. 6. Hold your breath for 3-5 seconds or for as long as possible. Allow the piston or ball to fall to the bottom of the column. 7. Remove the mouthpiece from your mouth and breathe out normally. 8. Rest for a few seconds and repeat  Steps 1 through 7 at least 10 times every 1-2 hours when you are awake. Take your time and take a few normal breaths between deep breaths. 9. The spirometer may include an indicator to show your best effort. Use the indicator as a goal to work toward during each repetition. 10. After each set of 10 deep breaths, practice coughing to be sure your lungs are clear. If you have an incision (the cut made at the time of surgery), support your incision when coughing by placing a pillow or rolled up towels firmly against it. Once you are able to get out of bed, walk around indoors and cough well. You may stop using the incentive spirometer when instructed by your caregiver.  RISKS AND COMPLICATIONS  Take your time so you do not get dizzy or light-headed.  If you are in pain, you may need to take or ask for pain medication before doing incentive spirometry. It is harder to take a deep breath if you are having pain. AFTER USE  Rest and breathe slowly and easily.  It can be helpful to keep track of a log of your progress. Your caregiver can provide you with a simple table to help with this. If you are using the spirometer at home, follow these instructions: SEEK MEDICAL CARE IF:   You are having difficultly using the spirometer.  You have trouble using the spirometer as often as instructed.  Your pain medication is not giving enough relief while using the spirometer.  You develop fever of 100.5 F (38.1 C) or higher. SEEK IMMEDIATE MEDICAL CARE IF:   You cough up bloody sputum  that had not been present before.  You develop fever of 102 F (38.9 C) or greater.  You develop worsening pain at or near the incision site. MAKE SURE YOU:   Understand these instructions.  Will watch your condition.  Will get help right away if you are not doing well or get worse. Document Released: 06/02/2006 Document Revised: 04/14/2011 Document Reviewed: 08/03/2006 [facility] Patient Information 2014 [place], [place].   ________________________________________________________________________

## 2016-07-11 ENCOUNTER — Encounter (HOSPITAL_COMMUNITY)
Admission: RE | Admit: 2016-07-11 | Discharge: 2016-07-11 | Disposition: A | Discharge status: Home / Self Care | Payer: 59 | Source: Ambulatory Visit | Attending: Orthopedic Surgery | Admitting: Orthopedic Surgery

## 2016-07-11 ENCOUNTER — Encounter (HOSPITAL_COMMUNITY): Payer: Self-pay

## 2016-07-11 DIAGNOSIS — Z01810 Encounter for preprocedural cardiovascular examination: Secondary | ICD-10-CM | POA: Diagnosis not present

## 2016-07-11 DIAGNOSIS — Z01812 Encounter for preprocedural laboratory examination: Secondary | ICD-10-CM | POA: Insufficient documentation

## 2016-07-11 DIAGNOSIS — E119 Type 2 diabetes mellitus without complications: Secondary | ICD-10-CM | POA: Insufficient documentation

## 2016-07-11 DIAGNOSIS — Z01818 Encounter for other preprocedural examination: Secondary | ICD-10-CM | POA: Diagnosis present

## 2016-07-11 DIAGNOSIS — R9431 Abnormal electrocardiogram [ECG] [EKG]: Secondary | ICD-10-CM | POA: Insufficient documentation

## 2016-07-11 HISTORY — DX: Depression, unspecified: F32.A

## 2016-07-11 HISTORY — DX: Major depressive disorder, single episode, unspecified: F32.9

## 2016-07-11 HISTORY — DX: Unspecified asthma, uncomplicated: J45.909

## 2016-07-11 HISTORY — DX: Anxiety disorder, unspecified: F41.9

## 2016-07-11 LAB — CBC
HCT: 40.5 % (ref 36.0–46.0)
Hemoglobin: 14 g/dL (ref 12.0–15.0)
MCH: 30.8 pg (ref 26.0–34.0)
MCHC: 34.6 g/dL (ref 30.0–36.0)
MCV: 89.2 fL (ref 78.0–100.0)
Platelets: 329 10*3/uL (ref 150–400)
RBC: 4.54 MIL/uL (ref 3.87–5.11)
RDW: 13 % (ref 11.5–15.5)
WBC: 7.3 10*3/uL (ref 4.0–10.5)

## 2016-07-11 LAB — GLUCOSE, CAPILLARY: Glucose-Capillary: 123 mg/dL — ABNORMAL HIGH (ref 65–99)

## 2016-07-11 LAB — BASIC METABOLIC PANEL
Anion gap: 10 (ref 5–15)
BUN: 19 mg/dL (ref 6–20)
CHLORIDE: 97 mmol/L — AB (ref 101–111)
CO2: 29 mmol/L (ref 22–32)
Calcium: 9.6 mg/dL (ref 8.9–10.3)
Creatinine, Ser: 0.65 mg/dL (ref 0.44–1.00)
GFR calc Af Amer: >60 mL/min (ref >60)
GFR calc non Af Amer: >60 mL/min (ref >60)
GLUCOSE: 118 mg/dL — AB (ref 65–99)
POTASSIUM: 3.5 mmol/L (ref 3.5–5.1)
Sodium: 136 mmol/L (ref 135–145)

## 2016-07-11 LAB — ABO/RH: ABO/RH(D): O POS

## 2016-07-11 NOTE — Progress Notes (Signed)
Made dr Arby Barrettehatchett aware 07-11-16 ekg results and 12-12-11 ekg results, patient ok for surgery 07-14-16 per dr Arby Barrettehatchett.

## 2016-07-12 LAB — HEMOGLOBIN A1C
HEMOGLOBIN A1C: 5.7 % — AB (ref 4.8–5.6)
Mean Plasma Glucose: 117 mg/dL

## 2016-07-13 MED ORDER — DEXTROSE 5 % IV SOLN
3.0000 g | INTRAVENOUS | Status: AC
  Administered 2016-07-14: 3 g via INTRAVENOUS
  Filled 2016-07-13: qty 3

## 2016-07-13 NOTE — H&P (Signed)
UNICOMPARTMENTAL KNEE ADMISSION H&P  Patient is being admitted for left medial unicompartmental knee arthroplasty.  Subjective:  Chief Complaint:  Left knee medial compartmental primary OA /pain    HPI: Kim Brock, 56 y.o. female female, has a history of pain and functional disability in the left and has failed non-surgical conservative treatments for greater than 12 weeks to include NSAID's and/or analgesics, corticosteriod injections and activity modification.  Onset of symptoms was gradual, starting  years ago with gradually worsening course since that time.   Patient currently rates pain in the left knee(s) at 10 out of 10 with activity. Patient has night pain, worsening of pain with activity and weight bearing, pain that interferes with activities of daily living, pain with passive range of motion, crepitus and joint swelling.  Patient has evidence of periarticular osteophytes and joint space narrowing of the medial compartment by imaging studies.  There is no active infection.  Risks, benefits and expectations were discussed with the patient.  Risks including but not limited to the risk of anesthesia, blood clots, nerve damage, blood vessel damage, failure of the prosthesis, infection and up to and including death.  Patient understand the risks, benefits and expectations and wishes to proceed with surgery.   PCP: Kim Montana, MD  D/C Plans:      Home  Post-op Meds:       No Rx given   Tranexamic Acid:      To be given - IV  Decadron:    It is to be given  FYI:     ASA  Norco    Past Medical History:  Diagnosis Date  . Allergic rhinitis   . Anxiety   . Arthritis    oa both knees  . Asthma   . Bronchospasm    With URI  . Depression   . Depression with anxiety   . Diabetes mellitus without complication (HCC)    diet controlled  . Hypertension   . Morbid obesity with BMI of 50.0-59.9, adult (HCC)    BMI 59.3  . Morbid obesity with BMI of 50.0-59.9, adult (HCC)    . Near sighted    Wears contacts  . OSA on CPAP    on auto CPAP for life  . PONV (postoperative nausea and vomiting)    nausea likes zofran, pt has motion sickness  . Vitamin D deficiency      Past Surgical History:  Procedure Laterality Date  . CHOLECYSTECTOMY  1999  . DILATION AND CURETTAGE OF UTERUS  2011  . KNEE SURGERY  2009/2010   left and right  knees arthroscopic meniscal repair  . LAPAROSCOPIC GASTRIC BANDING N/A 03/30/2012   Procedure: LAPAROSCOPIC GASTRIC BANDING;  Surgeon: Atilano Ina, MD,FACS;  Location: [facility] ORS;  Service: General;  Laterality: N/A;  Laparoscopic Adjustable Gastric Band Placement,   . MESH APPLIED TO LAP PORT N/A 03/30/2012   Procedure: MESH APPLIED TO LAP PORT;  Surgeon: Atilano Ina, MD,FACS;  Location: [facility] ORS;  Service: General;  Laterality: N/A;  . TUBAL LIGATION  2001    Allergies  Allergen Reactions  . Chlorhexidine Dermatitis  . Demerol [Meperidine] Other (See Comments)    Blood pressure drops and makes me violently nauseous.  Marland Kitchen Epinephrine Other (See Comments)    In Novocaine - makes jittery and heart race  . Percocet [Oxycodone-Acetaminophen]     Hallucinate, Family h/o of poor tolerance.  Will not take - can tolerate Vicodin  . Tape Other (See Comments)  Steri strips - blisters    Social History  Substance Use Topics  . Smoking status: Former Smoker    Packs/day: 1.00    Years: 11.00    Types: Cigarettes    Quit date: 08/06/1991  . Smokeless tobacco: Never Used  . Alcohol use 0.0 oz/week    1 - 2 Glasses of wine per week     Comment: 3 drinks per week    Family History  Problem Relation Age of Onset  . Diabetes Mother   . Hypertension Mother   . Osteoarthritis Mother   . Hyperlipidemia Father   . Cancer Paternal Grandmother        Colon     Review of Systems  Constitutional: Negative.   HENT: Negative.   Eyes: Negative.   Respiratory: Negative.   Cardiovascular: Negative.   Gastrointestinal: Negative.    Genitourinary: Negative.   Musculoskeletal: Positive for joint pain.  Skin: Negative.   Neurological: Negative.   Endo/Heme/Allergies: Positive for environmental allergies.  Psychiatric/Behavioral: Positive for depression.     Objective:   Physical Exam  Constitutional: She is well-developed, well-nourished, and in no distress.  Eyes: Pupils are equal, round, and reactive to light.  Neck: Neck supple. No JVD present. No tracheal deviation present. No thyromegaly present.  Cardiovascular: Normal rate, regular rhythm and intact distal pulses.   Pulmonary/Chest: Effort normal and breath sounds normal. No respiratory distress. She has no wheezes.  Abdominal: Soft. There is no tenderness. There is no guarding.  Musculoskeletal:       Left knee: She exhibits decreased range of motion, swelling and bony tenderness. She exhibits no ecchymosis, no deformity, no laceration and no erythema. Tenderness found. Medial joint line tenderness noted. No lateral joint line tenderness noted.  Lymphadenopathy:    She has no cervical adenopathy.  Neurological: She is alert.  Skin: Skin is warm and dry.       Labs:  Estimated body mass index is 51.48 kg/m as calculated from the following:   Height as of 07/11/16: 5' 4.75" (1.645 m).   Weight as of 07/11/16: 139.3 kg (307 lb).   Imaging Review Plain radiographs demonstrate severe degenerative joint disease of the left knee(s) medial compartment. The overall alignment is mild varus. The bone quality appears to be good for age and reported activity level.  Assessment/Plan:  End stage arthritis, left knee medial compartment  The patient history, physical examination, clinical judgment of the provider and imaging studies are consistent with end stage degenerative joint disease of the left knee(s) and medial unicompartmental knee arthroplasty is deemed medically necessary. The treatment options including medical management, injection therapy  arthroscopy and arthroplasty were discussed at length. The risks and benefits of total knee arthroplasty were presented and reviewed. The risks due to aseptic loosening, infection, stiffness, patella tracking problems, thromboembolic complications and other imponderables were discussed. The patient acknowledged the explanation, agreed to proceed with the plan and consent was signed. Patient is being admitted for outpatient / observation treatment for surgery, pain control, PT, OT, prophylactic antibiotics, VTE prophylaxis, progressive ambulation and ADL's and discharge planning. The patient is planning to be discharged home.     Anastasio AuerbachMatthew S. Ellar Hakala   PA-C  07/13/2016, 9:04 AM

## 2016-07-14 ENCOUNTER — Ambulatory Visit (HOSPITAL_COMMUNITY): Payer: 59 | Admitting: Certified Registered Nurse Anesthetist

## 2016-07-14 ENCOUNTER — Observation Stay (HOSPITAL_COMMUNITY)
Admission: RE | Admit: 2016-07-14 | Discharge: 2016-07-15 | Disposition: A | Discharge status: Home / Self Care | Payer: 59 | Source: Ambulatory Visit | Attending: Orthopedic Surgery | Admitting: Orthopedic Surgery

## 2016-07-14 ENCOUNTER — Encounter (HOSPITAL_COMMUNITY)
Admission: RE | Disposition: A | Discharge status: Home / Self Care | Payer: Self-pay | Source: Ambulatory Visit | Attending: Orthopedic Surgery

## 2016-07-14 ENCOUNTER — Encounter (HOSPITAL_COMMUNITY): Payer: Self-pay | Admitting: *Deleted

## 2016-07-14 DIAGNOSIS — E119 Type 2 diabetes mellitus without complications: Secondary | ICD-10-CM | POA: Insufficient documentation

## 2016-07-14 DIAGNOSIS — G4733 Obstructive sleep apnea (adult) (pediatric): Secondary | ICD-10-CM | POA: Diagnosis not present

## 2016-07-14 DIAGNOSIS — Z79899 Other long term (current) drug therapy: Secondary | ICD-10-CM | POA: Diagnosis not present

## 2016-07-14 DIAGNOSIS — Z7951 Long term (current) use of inhaled steroids: Secondary | ICD-10-CM | POA: Insufficient documentation

## 2016-07-14 DIAGNOSIS — Z888 Allergy status to other drugs, medicaments and biological substances status: Secondary | ICD-10-CM | POA: Insufficient documentation

## 2016-07-14 DIAGNOSIS — I10 Essential (primary) hypertension: Secondary | ICD-10-CM | POA: Diagnosis not present

## 2016-07-14 DIAGNOSIS — M1712 Unilateral primary osteoarthritis, left knee: Secondary | ICD-10-CM | POA: Diagnosis not present

## 2016-07-14 DIAGNOSIS — Z885 Allergy status to narcotic agent status: Secondary | ICD-10-CM | POA: Diagnosis not present

## 2016-07-14 DIAGNOSIS — Z6841 Body Mass Index (BMI) 40.0 and over, adult: Secondary | ICD-10-CM | POA: Diagnosis not present

## 2016-07-14 DIAGNOSIS — F329 Major depressive disorder, single episode, unspecified: Secondary | ICD-10-CM | POA: Insufficient documentation

## 2016-07-14 DIAGNOSIS — F419 Anxiety disorder, unspecified: Secondary | ICD-10-CM | POA: Insufficient documentation

## 2016-07-14 DIAGNOSIS — G8918 Other acute postprocedural pain: Secondary | ICD-10-CM | POA: Diagnosis not present

## 2016-07-14 DIAGNOSIS — Z87891 Personal history of nicotine dependence: Secondary | ICD-10-CM | POA: Diagnosis not present

## 2016-07-14 DIAGNOSIS — Z96652 Presence of left artificial knee joint: Secondary | ICD-10-CM

## 2016-07-14 DIAGNOSIS — J45909 Unspecified asthma, uncomplicated: Secondary | ICD-10-CM | POA: Insufficient documentation

## 2016-07-14 HISTORY — PX: PARTIAL KNEE ARTHROPLASTY: SHX2174

## 2016-07-14 LAB — GLUCOSE, CAPILLARY
GLUCOSE-CAPILLARY: 99 mg/dL (ref 65–99)
Glucose-Capillary: 112 mg/dL — ABNORMAL HIGH (ref 65–99)

## 2016-07-14 LAB — TYPE AND SCREEN
ABO/RH(D): O POS
ANTIBODY SCREEN: NEGATIVE

## 2016-07-14 SURGERY — ARTHROPLASTY, KNEE, UNICOMPARTMENTAL
Anesthesia: Spinal | Site: Knee | Laterality: Left

## 2016-07-14 MED ORDER — MIDAZOLAM HCL 2 MG/2ML IJ SOLN: 0.5000 mg | Freq: Once | INTRAMUSCULAR | Status: DC | PRN

## 2016-07-14 MED ORDER — HYDROCODONE-ACETAMINOPHEN 7.5-325 MG PO TABS
1.0000 | ORAL_TABLET | ORAL | Status: DC
  Administered 2016-07-14 – 2016-07-15 (×3): 1 via ORAL
  Filled 2016-07-14 (×3): qty 1

## 2016-07-14 MED ORDER — PROMETHAZINE HCL 25 MG/ML IJ SOLN: 6.2500 mg | INTRAMUSCULAR | Status: DC | PRN

## 2016-07-14 MED ORDER — METOCLOPRAMIDE HCL 5 MG/ML IJ SOLN: 5.0000 mg | Freq: Three times a day (TID) | INTRAMUSCULAR | Status: DC | PRN

## 2016-07-14 MED ORDER — METOCLOPRAMIDE HCL 5 MG PO TABS: 5.0000 mg | ORAL_TABLET | Freq: Three times a day (TID) | ORAL | Status: DC | PRN

## 2016-07-14 MED ORDER — ALUM & MAG HYDROXIDE-SIMETH 200-200-20 MG/5ML PO SUSP: 30.0000 mL | ORAL | Status: DC | PRN

## 2016-07-14 MED ORDER — BUDESONIDE 0.5 MG/2ML IN SUSP: 0.5000 mg | Freq: Two times a day (BID) | RESPIRATORY_TRACT | Status: DC

## 2016-07-14 MED ORDER — MIDAZOLAM HCL 2 MG/2ML IJ SOLN
INTRAMUSCULAR | Status: AC
  Filled 2016-07-14: qty 2

## 2016-07-14 MED ORDER — BUPIVACAINE-EPINEPHRINE 0.25% -1:200000 IJ SOLN
INTRAMUSCULAR | Status: DC | PRN
  Administered 2016-07-14: 30 mL

## 2016-07-14 MED ORDER — SODIUM CHLORIDE 0.9 % IV SOLN
INTRAVENOUS | Status: DC
  Administered 2016-07-14: 21:00:00 via INTRAVENOUS

## 2016-07-14 MED ORDER — SODIUM CHLORIDE 0.9 % IJ SOLN
INTRAMUSCULAR | Status: AC
  Filled 2016-07-14: qty 50

## 2016-07-14 MED ORDER — FENTANYL CITRATE (PF) 100 MCG/2ML IJ SOLN
INTRAMUSCULAR | Status: AC
  Filled 2016-07-14: qty 2

## 2016-07-14 MED ORDER — KETOROLAC TROMETHAMINE 30 MG/ML IJ SOLN
INTRAMUSCULAR | Status: AC
  Filled 2016-07-14: qty 1

## 2016-07-14 MED ORDER — KETAMINE HCL 10 MG/ML IJ SOLN
INTRAMUSCULAR | Status: AC
  Filled 2016-07-14: qty 1

## 2016-07-14 MED ORDER — ASPIRIN EC 325 MG PO TBEC
325.0000 mg | DELAYED_RELEASE_TABLET | Freq: Two times a day (BID) | ORAL | Status: DC
  Filled 2016-07-14: qty 1

## 2016-07-14 MED ORDER — ESCITALOPRAM OXALATE 20 MG PO TABS: 30.0000 mg | ORAL_TABLET | Freq: Every day | ORAL | Status: DC

## 2016-07-14 MED ORDER — METHOCARBAMOL 500 MG PO TABS
500.0000 mg | ORAL_TABLET | Freq: Four times a day (QID) | ORAL | Status: DC | PRN
  Administered 2016-07-14: 500 mg via ORAL
  Filled 2016-07-14 (×2): qty 1

## 2016-07-14 MED ORDER — FENTANYL CITRATE (PF) 100 MCG/2ML IJ SOLN
INTRAMUSCULAR | Status: DC | PRN
  Administered 2016-07-14: 100 ug via INTRAVENOUS

## 2016-07-14 MED ORDER — HYDROMORPHONE HCL 1 MG/ML IJ SOLN: 0.2500 mg | INTRAMUSCULAR | Status: DC | PRN

## 2016-07-14 MED ORDER — ONDANSETRON HCL 4 MG/2ML IJ SOLN: 4.0000 mg | Freq: Four times a day (QID) | INTRAMUSCULAR | Status: DC | PRN

## 2016-07-14 MED ORDER — FENTANYL CITRATE (PF) 100 MCG/2ML IJ SOLN
INTRAMUSCULAR | Status: AC
  Administered 2016-07-14: 100 ug via INTRAVENOUS
  Filled 2016-07-14: qty 2

## 2016-07-14 MED ORDER — BUPIVACAINE HCL (PF) 0.75 % IJ SOLN
INTRAMUSCULAR | Status: DC | PRN
  Administered 2016-07-14: 2 mL via INTRATHECAL

## 2016-07-14 MED ORDER — DEXAMETHASONE SODIUM PHOSPHATE 10 MG/ML IJ SOLN
10.0000 mg | Freq: Once | INTRAMUSCULAR | Status: AC
  Administered 2016-07-14: 10 mg via INTRAVENOUS

## 2016-07-14 MED ORDER — ROPIVACAINE HCL 5 MG/ML IJ SOLN
INTRAMUSCULAR | Status: DC | PRN
  Administered 2016-07-14: 30 mL via PERINEURAL

## 2016-07-14 MED ORDER — LACTATED RINGERS IV SOLN: INTRAVENOUS | Status: DC

## 2016-07-14 MED ORDER — FENTANYL CITRATE (PF) 100 MCG/2ML IJ SOLN: 25.0000 ug | INTRAMUSCULAR | Status: DC | PRN

## 2016-07-14 MED ORDER — PHENYLEPHRINE HCL 10 MG/ML IJ SOLN
INTRAMUSCULAR | Status: DC | PRN
  Administered 2016-07-14: 80 ug via INTRAVENOUS

## 2016-07-14 MED ORDER — PHENOL 1.4 % MT LIQD: 1.0000 | OROMUCOSAL | Status: DC | PRN

## 2016-07-14 MED ORDER — SODIUM CHLORIDE 0.9 % IJ SOLN
INTRAMUSCULAR | Status: DC | PRN
  Administered 2016-07-14: 60 mL

## 2016-07-14 MED ORDER — PROPOFOL 500 MG/50ML IV EMUL
INTRAVENOUS | Status: DC | PRN
  Administered 2016-07-14: 75 ug/kg/min via INTRAVENOUS

## 2016-07-14 MED ORDER — MEPERIDINE HCL 50 MG/ML IJ SOLN: 6.2500 mg | INTRAMUSCULAR | Status: DC | PRN

## 2016-07-14 MED ORDER — CEFAZOLIN SODIUM-DEXTROSE 2-4 GM/100ML-% IV SOLN
2.0000 g | Freq: Four times a day (QID) | INTRAVENOUS | Status: AC
  Administered 2016-07-14 – 2016-07-15 (×2): 2 g via INTRAVENOUS
  Filled 2016-07-14 (×2): qty 100

## 2016-07-14 MED ORDER — SODIUM CHLORIDE 0.9 % IJ SOLN
INTRAMUSCULAR | Status: AC
  Filled 2016-07-14: qty 10

## 2016-07-14 MED ORDER — TRANEXAMIC ACID 1000 MG/10ML IV SOLN
1000.0000 mg | Freq: Once | INTRAVENOUS | Status: AC
  Administered 2016-07-14: 1000 mg via INTRAVENOUS
  Filled 2016-07-14: qty 1100

## 2016-07-14 MED ORDER — DIPHENHYDRAMINE HCL 25 MG PO CAPS: 25.0000 mg | ORAL_CAPSULE | Freq: Four times a day (QID) | ORAL | Status: DC | PRN

## 2016-07-14 MED ORDER — METHOCARBAMOL 1000 MG/10ML IJ SOLN
500.0000 mg | Freq: Four times a day (QID) | INTRAVENOUS | Status: DC | PRN
  Filled 2016-07-14: qty 5

## 2016-07-14 MED ORDER — AMLODIPINE BESYLATE 10 MG PO TABS
10.0000 mg | ORAL_TABLET | Freq: Every day | ORAL | Status: DC
Start: 2016-07-15 — End: 2016-07-15
  Administered 2016-07-15: 10 mg via ORAL
  Filled 2016-07-14: qty 1

## 2016-07-14 MED ORDER — PROPOFOL 10 MG/ML IV BOLUS
INTRAVENOUS | Status: AC
  Filled 2016-07-14: qty 60

## 2016-07-14 MED ORDER — LACTATED RINGERS IV SOLN
INTRAVENOUS | Status: DC | PRN
  Administered 2016-07-14 (×3): via INTRAVENOUS

## 2016-07-14 MED ORDER — DEXAMETHASONE SODIUM PHOSPHATE 10 MG/ML IJ SOLN: 10.0000 mg | Freq: Once | INTRAMUSCULAR | Status: DC

## 2016-07-14 MED ORDER — FERROUS SULFATE 325 (65 FE) MG PO TABS
325.0000 mg | ORAL_TABLET | Freq: Three times a day (TID) | ORAL | Status: DC
  Administered 2016-07-15: 325 mg via ORAL
  Filled 2016-07-14: qty 1

## 2016-07-14 MED ORDER — BUPIVACAINE-EPINEPHRINE (PF) 0.25% -1:200000 IJ SOLN
INTRAMUSCULAR | Status: AC
  Filled 2016-07-14: qty 30

## 2016-07-14 MED ORDER — MAGNESIUM CITRATE PO SOLN: 1.0000 | Freq: Once | ORAL | Status: DC | PRN

## 2016-07-14 MED ORDER — ESCITALOPRAM OXALATE 20 MG PO TABS
30.0000 mg | ORAL_TABLET | Freq: Every day | ORAL | Status: DC
  Administered 2016-07-15: 30 mg via ORAL
  Filled 2016-07-14: qty 1

## 2016-07-14 MED ORDER — FENTANYL CITRATE (PF) 100 MCG/2ML IJ SOLN
100.0000 ug | Freq: Once | INTRAMUSCULAR | Status: AC
  Administered 2016-07-14: 100 ug via INTRAVENOUS

## 2016-07-14 MED ORDER — CLONAZEPAM 0.5 MG PO TABS: 0.2500 mg | ORAL_TABLET | Freq: Two times a day (BID) | ORAL | Status: DC | PRN

## 2016-07-14 MED ORDER — BISACODYL 10 MG RE SUPP: 10.0000 mg | Freq: Every day | RECTAL | Status: DC | PRN

## 2016-07-14 MED ORDER — LORATADINE 10 MG PO TABS: 10.0000 mg | ORAL_TABLET | Freq: Every day | ORAL | Status: DC | PRN

## 2016-07-14 MED ORDER — KETOROLAC TROMETHAMINE 30 MG/ML IJ SOLN
INTRAMUSCULAR | Status: DC | PRN
  Administered 2016-07-14: 30 mg

## 2016-07-14 MED ORDER — TRANEXAMIC ACID 1000 MG/10ML IV SOLN
1000.0000 mg | INTRAVENOUS | Status: AC
  Administered 2016-07-14: 1000 mg via INTRAVENOUS
  Filled 2016-07-14: qty 1100

## 2016-07-14 MED ORDER — BECLOMETHASONE DIPROP HFA 80 MCG/ACT IN AERB
2.0000 | INHALATION_SPRAY | Freq: Two times a day (BID) | RESPIRATORY_TRACT | Status: DC
  Administered 2016-07-15: 2 via RESPIRATORY_TRACT

## 2016-07-14 MED ORDER — ONDANSETRON HCL 4 MG PO TABS: 4.0000 mg | ORAL_TABLET | Freq: Four times a day (QID) | ORAL | Status: DC | PRN

## 2016-07-14 MED ORDER — CELECOXIB 200 MG PO CAPS
200.0000 mg | ORAL_CAPSULE | Freq: Two times a day (BID) | ORAL | Status: DC
  Administered 2016-07-14 – 2016-07-15 (×2): 200 mg via ORAL
  Filled 2016-07-14 (×2): qty 1

## 2016-07-14 MED ORDER — POVIDONE-IODINE 7.5 % EX SOLN: Freq: Once | CUTANEOUS | Status: DC

## 2016-07-14 MED ORDER — POLYETHYLENE GLYCOL 3350 17 G PO PACK
17.0000 g | PACK | Freq: Two times a day (BID) | ORAL | Status: DC
  Administered 2016-07-15: 17 g via ORAL
  Filled 2016-07-14 (×2): qty 1

## 2016-07-14 MED ORDER — ALBUTEROL SULFATE (2.5 MG/3ML) 0.083% IN NEBU: 2.5000 mg | INHALATION_SOLUTION | RESPIRATORY_TRACT | Status: DC | PRN

## 2016-07-14 MED ORDER — DOCUSATE SODIUM 100 MG PO CAPS
100.0000 mg | ORAL_CAPSULE | Freq: Two times a day (BID) | ORAL | Status: DC
  Administered 2016-07-14 – 2016-07-15 (×2): 100 mg via ORAL
  Filled 2016-07-14 (×2): qty 1

## 2016-07-14 MED ORDER — KETAMINE HCL 10 MG/ML IJ SOLN
INTRAMUSCULAR | Status: DC | PRN
  Administered 2016-07-14: 30 mg via INTRAVENOUS

## 2016-07-14 MED ORDER — MIDAZOLAM HCL 5 MG/ML IJ SOLN
2.0000 mg | Freq: Once | INTRAMUSCULAR | Status: AC
  Administered 2016-07-14: 2 mg via INTRAVENOUS

## 2016-07-14 MED ORDER — PROMETHAZINE HCL 25 MG/ML IJ SOLN
6.2500 mg | INTRAMUSCULAR | Status: DC | PRN
Start: 2016-07-14 — End: 2016-07-14

## 2016-07-14 MED ORDER — ONDANSETRON HCL 4 MG/2ML IJ SOLN
INTRAMUSCULAR | Status: DC | PRN
  Administered 2016-07-14: 4 mg via INTRAVENOUS

## 2016-07-14 MED ORDER — MENTHOL 3 MG MT LOZG: 1.0000 | LOZENGE | OROMUCOSAL | Status: DC | PRN

## 2016-07-14 MED ORDER — MIDAZOLAM HCL 5 MG/5ML IJ SOLN
INTRAMUSCULAR | Status: DC | PRN
  Administered 2016-07-14: 2 mg via INTRAVENOUS
  Administered 2016-07-14: 1 mg via INTRAVENOUS

## 2016-07-14 SURGICAL SUPPLY — 43 items
ADH SKN CLS APL DERMABOND .7 (GAUZE/BANDAGES/DRESSINGS) ×1
BAG DECANTER FOR FLEXI CONT (MISCELLANEOUS) IMPLANT
BAG SPEC THK2 15X12 ZIP CLS (MISCELLANEOUS)
BAG ZIPLOCK 12X15 (MISCELLANEOUS) IMPLANT
BANDAGE ACE 6X5 VEL STRL LF (GAUZE/BANDAGES/DRESSINGS) ×2 IMPLANT
BANDAGE ELASTIC 6 VELCRO ST LF (GAUZE/BANDAGES/DRESSINGS) ×1 IMPLANT
BLADE SAW RECIPROCATING 77.5 (BLADE) ×2 IMPLANT
BLADE SAW SGTL 13.0X1.19X90.0M (BLADE) ×2 IMPLANT
BONE CEMENT GENTAMICIN (Cement) ×2 IMPLANT
BOWL SMART MIX CTS (DISPOSABLE) ×2 IMPLANT
CAPT KNEE PARTIAL 2 ×1 IMPLANT
CEMENT BONE GENTAMICIN 40 (Cement) IMPLANT
CLOTH BEACON ORANGE TIMEOUT ST (SAFETY) ×2 IMPLANT
COVER SURGICAL LIGHT HANDLE (MISCELLANEOUS) ×2 IMPLANT
CUFF TOURN SGL QUICK 34 (TOURNIQUET CUFF) ×2
CUFF TRNQT CYL 34X4X40X1 (TOURNIQUET CUFF) ×1 IMPLANT
DERMABOND ADVANCED (GAUZE/BANDAGES/DRESSINGS) ×1
DERMABOND ADVANCED .7 DNX12 (GAUZE/BANDAGES/DRESSINGS) ×1 IMPLANT
DRAPE U-SHAPE 47X51 STRL (DRAPES) ×2 IMPLANT
DRESSING AQUACEL AG SP 3.5X10 (GAUZE/BANDAGES/DRESSINGS) ×1 IMPLANT
DRSG AQUACEL AG SP 3.5X10 (GAUZE/BANDAGES/DRESSINGS) ×2
DURAPREP 26ML APPLICATOR (WOUND CARE) ×4 IMPLANT
ELECT REM PT RETURN 15FT ADLT (MISCELLANEOUS) ×2 IMPLANT
GLOVE BIOGEL M 7.0 STRL (GLOVE) IMPLANT
GLOVE BIOGEL PI IND STRL 7.5 (GLOVE) ×1 IMPLANT
GLOVE BIOGEL PI IND STRL 8.5 (GLOVE) ×1 IMPLANT
GLOVE BIOGEL PI INDICATOR 7.5 (GLOVE) ×1
GLOVE BIOGEL PI INDICATOR 8.5 (GLOVE) ×1
GLOVE ECLIPSE 8.0 STRL XLNG CF (GLOVE) ×2 IMPLANT
GLOVE ORTHO TXT STRL SZ7.5 (GLOVE) ×4 IMPLANT
GOWN STRL REUS W/TWL LRG LVL3 (GOWN DISPOSABLE) ×2 IMPLANT
GOWN STRL REUS W/TWL XL LVL3 (GOWN DISPOSABLE) ×2 IMPLANT
LEGGING LITHOTOMY PAIR STRL (DRAPES) ×2 IMPLANT
MANIFOLD NEPTUNE II (INSTRUMENTS) ×2 IMPLANT
PACK TOTAL KNEE CUSTOM (KITS) ×2 IMPLANT
SUT MNCRL AB 4-0 PS2 18 (SUTURE) ×2 IMPLANT
SUT STRATAFIX 0 PDS 27 VIOLET (SUTURE) ×2
SUT VIC AB 1 CT1 36 (SUTURE) ×2 IMPLANT
SUT VIC AB 2-0 CT1 27 (SUTURE) ×4
SUT VIC AB 2-0 CT1 TAPERPNT 27 (SUTURE) ×2 IMPLANT
SUTURE STRATFX 0 PDS 27 VIOLET (SUTURE) ×1 IMPLANT
SYR 50ML LL SCALE MARK (SYRINGE) ×2 IMPLANT
TRAY FOLEY W/METER SILVER 16FR (SET/KITS/TRAYS/PACK) IMPLANT

## 2016-07-14 NOTE — Interval H&P Note (Signed)
History and Physical Interval Note:  07/14/2016 2:17 PM  Kim Brock  has presented today for surgery, with the diagnosis of Left knee osteoarthritis medial  The various methods of treatment have been discussed with the patient and family. After consideration of risks, benefits and other options for treatment, the patient has consented to  Procedure(s): UNICOMPARTMENTAL LEFT KNEE (Left) as a surgical intervention .  The patient's history has been reviewed, patient examined, no change in status, stable for surgery.  I have reviewed the patient's chart and labs.  Questions were answered to the patient's satisfaction.     Shelda PalLIN,Dhana Totton D

## 2016-07-14 NOTE — Anesthesia Procedure Notes (Signed)
Anesthesia Regional Block: Adductor canal block   Pre-Anesthetic Checklist: ,, timeout performed, Correct Patient, Correct Site, Correct Laterality, Correct Procedure, Correct Position, site marked, Risks and benefits discussed,  Surgical consent,  Pre-op evaluation,  At surgeon's request and post-op pain management  Laterality: Left and Lower  Prep: Betadine       Needles:   Needle Type: Echogenic Needle     Needle Length: 9cm  Needle Gauge: 21     Additional Needles:   Procedures: ultrasound guided,,,,,,,,  Narrative:  Start time: 07/14/2016 3:32 PM End time: 07/14/2016 3:39 PM Injection made incrementally with aspirations every 5 mL.  Performed by: Personally  Anesthesiologist: Jean RosenthalJACKSON, Kaitlyn Franko  Additional Notes: Pt identified in Holding room.  Monitors applied. Working IV access confirmed. Sterile prep L thigh.  #21ga ECHOgenic needle into adductor canal with US guidance.  30cc 0.5% Ropivacaine injected incrementally after negative test dose.  Patient asymptomatic, VSS, no heme aspirated, tolerated well.  Sandford Craze Liviana Mills, MD

## 2016-07-14 NOTE — Discharge Instructions (Signed)

## 2016-07-14 NOTE — Anesthesia Preprocedure Evaluation (Addendum)
Anesthesia Evaluation  Patient identified by MRN, date of birth, ID band Patient awake    Reviewed: Allergy & Precautions, NPO status , Patient's Chart, lab work & pertinent test results  History of Anesthesia Complications (+) PONV and Emergence Delirium  Airway Mallampati: II  TM Distance: >3 FB Neck ROM: Full    Dental  (+) Missing, Dental Advisory Given   Pulmonary sleep apnea and Continuous Positive Airway Pressure Ventilation , former smoker (quit 1993),    breath sounds clear to auscultation       Cardiovascular hypertension, Pt. on medications (-) angina Rhythm:Regular Rate:Normal     Neuro/Psych Anxiety Depression negative neurological ROS     GI/Hepatic Neg liver ROS, neg GERD  ,S/p lap band   Endo/Other  diabetes (glu 112)Morbid obesity  Renal/GU negative Renal ROS     Musculoskeletal  (+) Arthritis ,   Abdominal (+) + obese,   Peds  Hematology negative hematology ROS (+)   Anesthesia Other Findings   Reproductive/Obstetrics                            Anesthesia Physical Anesthesia Plan  ASA: III  Anesthesia Plan: Spinal   Post-op Pain Management:  Regional for Post-op pain   Induction: Intravenous  PONV Risk Score and Plan: 4 or greater and Ondansetron, Dexamethasone, Propofol, Midazolam and Scopolamine patch - Pre-op  Airway Management Planned: Simple Face Mask and Natural Airway  Additional Equipment:   Intra-op Plan:   Post-operative Plan:   Informed Consent: I have reviewed the patients History and Physical, chart, labs and discussed the procedure including the risks, benefits and alternatives for the proposed anesthesia with the patient or authorized representative who has indicated his/her understanding and acceptance.   Dental advisory given  Plan Discussed with: CRNA and Surgeon  Anesthesia Plan Comments: (Plan routine monitors, SAB with adductor  canal block for post op analgesia)       Anesthesia Quick Evaluation

## 2016-07-14 NOTE — Op Note (Signed)
NAME: Kim Brock    MEDICAL RECORD NO.: [id number]   FACILITY: [facility]   DATE OF BIRTH: 1960/08/02  PHYSICIAN: Kim Brock, M.D.    DATE OF PROCEDURE: 07/14/2016    OPERATIVE REPORT   PREOPERATIVE DIAGNOSIS: Left knee medial compartment osteoarthritis.   POSTOPERATIVE DIAGNOSIS: Left knee medial compartment osteoarthritis.  PROCEDURE: Left partial knee replacement utilizing Biomet Oxford knee  component, size small femur, a left medial size B tibial tray with a size 4 insert.   SURGEON: Kim Brock, M.D.   ASSISTANT: Kim Brock, PAC.  Please note that Kim Brock was present for the entirety of the case,  utilized for preoperative positioning, perioperative retractor  management, general facilitation of the case and primary wound closure.   ANESTHESIA: Spinal plus regional.   SPECIMENS: None.   COMPLICATIONS: None.  DRAINS: None   TOURNIQUET TIME: 32 minutes at 250 mmHg.   INDICATIONS FOR PROCEDURE: The patient is a 56 y.o. patient of mine who presented for evaluation of left knee pain.  They presented with primary complaints of pain on the medial side of their knee. Radiographs revealed advanced medial compartment arthritis with specifically an antero-medial wear pattern.  There was bone on bone changes noted with subchondral sclerosis and osteophytes present. The patient has had progressive problems failing to respond to conservative measures of medications, injections and activity modification. Risks of infection, DVT, component failure, need for future revision surgery were all discussed and reviewed.  Consent was obtained for benefit of pain relief.   At this point in her treatment plan we discussed all pros and cons, risks and benefits of surgical intervention in the setting of her obesity.  She is at this point willing to assume the risks as have been extensively reviewed with her as she feels she has no other options at this point in time.  PROCEDURE IN  DETAIL: The patient was brought to the operative theater.  Once adequate anesthesia, preoperative antibiotics, 2gm of Ancef, 1 gm of Tranexamic Acid, and 10 mg of Decadron administered, the patient was positioned in supine position with a left thigh tourniquet  placed. The left lower extremity was prepped and draped in sterile  fashion with the leg on the Oxford leg holder.  The leg was allowed to flex to 120 degrees. A time-out  was performed identifying the patient, planned procedure, and extremity.  The leg was exsanguinated, tourniquet elevated to 250 mmHg. A midline  incision was made from the proximal pole of the patella to the tibial tubercle. A  soft tissue plane was created and partial median arthrotomy was then  made to allow for subluxation of the patella. Following initial synovectomy and  debridement, the osteophytes were removed off the medial aspect of the  knee.   Attention was first directed to the tibia. The tibial  extramedullary guide was positioned over the anterior crest of the tibia  and pinned into position, and using a measured resection guide from the  Oxford system, a 4 mm resection was made off the proximal tibia. First  the reciprocating saw along the medial aspect of the tibial spines, then the oscillating saw.    At this point, I sized this cut surface seem to be best fit for a size B tibial tray.  With the retractors out of the wound and the knee held at 90 degrees the 4 feeler gauge had appropriate tension on the medial ligament.   At this point, the femoral canal was opened with  a drill and the  intramedullary rod passed. Then using the guide for a small resection off  the posterior aspect of the femur was positioned over the mid portion of the medial femoral condyle.  The orientation was set using the guide that mates the femoral guide to the intramedullary rod.  The 2 drill holes were made into the distal femur.  The posterior guide was then impacted into  place and the posterior  femoral cut made.  At this point, I milled the distal femur with a size 5 spigot in place. At this point, we did a trial reduction of the small femur, size b tibial tray and a size 4 feeler gauge. At 90 degrees of  flexion and at 20 degrees of flexion the knee had symmetric tension on  the ligaments.   Given these findings, the trial femoral component was removed. Final preparation of tibia was carried out by pinning it in position. Then  using a reciprocating saw I removed bone for the keel. Further bone was  removed with an osteotome.  Trial reduction was now carried out with the small femur, the left B medial keeled  tibia, and a size 4 lollipop insert. The balance of the  ligaments appeared to be symmetric at 20 degrees and 90 degrees. Given  all these findings, the trial components were removed.   Cement was mixed. The final components were opened. The knee was irrigated with  normal saline solution. Then final debridements of the  soft tissue was carried out, I also drilled the sclerotic bone with a drill.  The final components were cemented with a single batch of cement in a  two-stage technique with the tibial component cemented first. The knee  was then brought  to 45 degrees of flexion with a 4 feeler gauge, held with pressure for a minute and half.  After this the femoral component was cemented in place.  The knee was again held at 45 degrees of flexion while the cement fully cured.  Excess cement was removed throughout the knee. Tourniquet was let down  after 32 minutes. After the cement had fully cured and excessive cement  was removed throughout the knee there was no visualized cement present.   The final size 4 left medial insert to match the small femur was chosen and snapped into position. We re-irrigated  the knee. The extensor mechanism  was then reapproximated using a #1 Vicryl and #0 Stratafix with the knee in flexion. The  remaining wound was  closed with 2-0 Vicryl and a running 4-0 Monocryl.  The knee was cleaned, dried, and dressed sterilely using Dermabond and  Aquacel dressing. The patient  was brought to the recovery room, Ace wrap in place, tolerating the  procedure well. She will be in the hospital for overnight observation.  We will initiate physical therapy and progress to ambulate.     Kim Frankel Charlann Boxer, M.D.

## 2016-07-14 NOTE — Progress Notes (Signed)
Assisted Dr. Carswell Jackson with left, ultrasound guided, adductor canal block. Side rails up, monitors on throughout procedure. See vital signs in flow sheet. Tolerated Procedure well.  

## 2016-07-14 NOTE — Transfer of Care (Signed)
Immediate Anesthesia Transfer of Care Note  Patient: Kim Brock  Procedure(s) Performed: Procedure(s): UNICOMPARTMENTAL LEFT KNEE (Left)  Patient Location: PACU  Anesthesia Type:MAC  Level of Consciousness: awake, alert  and oriented  Airway & Oxygen Therapy: Patient Spontanous Breathing and Patient connected to face mask oxygen  Post-op Assessment: Report given to RN and Post -op Vital signs reviewed and stable  Post vital signs: Reviewed and stable  Last Vitals:  Vitals:   07/14/16 1543 07/14/16 1754  BP:  130/83  Pulse: 66 65  Resp: 10 (!) 28  Temp:  36.6 C    Last Pain:  Vitals:   07/14/16 1357  TempSrc: Oral         Complications: No apparent anesthesia complications

## 2016-07-14 NOTE — Anesthesia Postprocedure Evaluation (Signed)
Anesthesia Post Note  Patient: Kim Brock  Procedure(s) Performed: Procedure(s) (LRB): UNICOMPARTMENTAL LEFT KNEE (Left)     Patient location during evaluation: PACU Anesthesia Type: Spinal Level of consciousness: awake and alert, patient cooperative and oriented Pain management: pain level controlled Vital Signs Assessment: post-procedure vital signs reviewed and stable Respiratory status: spontaneous breathing, nonlabored ventilation, respiratory function stable and patient connected to nasal cannula oxygen Cardiovascular status: blood pressure returned to baseline and stable Postop Assessment: spinal receding, patient able to bend at knees and no signs of nausea or vomiting Anesthetic complications: no    Last Vitals:  Vitals:   07/14/16 1830 07/14/16 1845  BP: 99/82 (!) 124/94  Pulse: 75 77  Resp: (!) 22 20  Temp: 36.4 C 36.4 C    Last Pain:  Vitals:   07/14/16 1830  TempSrc:   PainSc: 0-No pain                 Bonita Brindisi,E. Hadeel Hillebrand

## 2016-07-14 NOTE — Anesthesia Procedure Notes (Signed)
Spinal  Start time: 07/14/2016 3:55 PM End time: 07/14/2016 4:00 PM Staffing Resident/CRNA: Kizzie FantasiaCARVER, Ryah Cribb J Performed: resident/CRNA  Preanesthetic Checklist Completed: patient identified, site marked, surgical consent, pre-op evaluation, timeout performed, IV checked, risks and benefits discussed and monitors and equipment checked Spinal Block Patient position: sitting Prep: Betadine Patient monitoring: heart rate, continuous pulse ox and blood pressure Approach: midline Location: L3-4 Injection technique: single-shot Needle Needle type: Sprotte  Needle gauge: 24 G Needle length: 9 cm Needle insertion depth: 8 cm Additional Notes Pt sitting position, sterile prep and drape, negative paresthesia negative heme.

## 2016-07-15 ENCOUNTER — Encounter (HOSPITAL_COMMUNITY): Payer: Self-pay | Admitting: Orthopedic Surgery

## 2016-07-15 DIAGNOSIS — M1712 Unilateral primary osteoarthritis, left knee: Secondary | ICD-10-CM | POA: Diagnosis not present

## 2016-07-15 LAB — BASIC METABOLIC PANEL
Anion gap: 9 (ref 5–15)
BUN: 19 mg/dL (ref 6–20)
CHLORIDE: 100 mmol/L — AB (ref 101–111)
CO2: 28 mmol/L (ref 22–32)
Calcium: 9 mg/dL (ref 8.9–10.3)
Creatinine, Ser: 0.56 mg/dL (ref 0.44–1.00)
GFR calc Af Amer: >60 mL/min (ref >60)
GLUCOSE: 155 mg/dL — AB (ref 65–99)
POTASSIUM: 3.8 mmol/L (ref 3.5–5.1)
Sodium: 137 mmol/L (ref 135–145)

## 2016-07-15 LAB — CBC
HEMATOCRIT: 37.5 % (ref 36.0–46.0)
Hemoglobin: 13.1 g/dL (ref 12.0–15.0)
MCH: 31 pg (ref 26.0–34.0)
MCHC: 34.9 g/dL (ref 30.0–36.0)
MCV: 88.7 fL (ref 78.0–100.0)
PLATELETS: 288 10*3/uL (ref 150–400)
RBC: 4.23 MIL/uL (ref 3.87–5.11)
RDW: 12.5 % (ref 11.5–15.5)
WBC: 10.2 10*3/uL (ref 4.0–10.5)

## 2016-07-15 MED ORDER — METHOCARBAMOL 500 MG PO TABS: 500.0000 mg | ORAL_TABLET | Freq: Four times a day (QID) | ORAL | 0 refills | Status: DC | PRN

## 2016-07-15 MED ORDER — HYDROCODONE-ACETAMINOPHEN 10-325 MG PO TABS
0.5000 | ORAL_TABLET | Freq: Four times a day (QID) | ORAL | Status: DC | PRN
  Administered 2016-07-15: 1 via ORAL
  Filled 2016-07-15: qty 1

## 2016-07-15 MED ORDER — ASPIRIN EC 81 MG PO TBEC
81.0000 mg | DELAYED_RELEASE_TABLET | Freq: Two times a day (BID) | ORAL | Status: DC
  Administered 2016-07-15: 81 mg via ORAL
  Filled 2016-07-15: qty 1

## 2016-07-15 MED ORDER — ASPIRIN 81 MG PO TBEC: 81.0000 mg | DELAYED_RELEASE_TABLET | Freq: Two times a day (BID) | ORAL | 0 refills | Status: AC

## 2016-07-15 MED ORDER — FERROUS SULFATE 325 (65 FE) MG PO TABS: 325.0000 mg | ORAL_TABLET | Freq: Three times a day (TID) | ORAL | 3 refills | Status: DC

## 2016-07-15 MED ORDER — HYDROCODONE-ACETAMINOPHEN 10-325 MG PO TABS: 0.5000 | ORAL_TABLET | Freq: Four times a day (QID) | ORAL | 0 refills | Status: DC | PRN

## 2016-07-15 MED ORDER — CELECOXIB 200 MG PO CAPS: 200.0000 mg | ORAL_CAPSULE | Freq: Two times a day (BID) | ORAL | 0 refills | Status: DC

## 2016-07-15 MED ORDER — POLYETHYLENE GLYCOL 3350 17 G PO PACK: 17.0000 g | PACK | Freq: Two times a day (BID) | ORAL | 0 refills | Status: DC

## 2016-07-15 MED ORDER — DOCUSATE SODIUM 100 MG PO CAPS: 100.0000 mg | ORAL_CAPSULE | Freq: Two times a day (BID) | ORAL | 0 refills | Status: DC

## 2016-07-15 NOTE — Progress Notes (Signed)
Patient ID: Kim Brock, female   DOB: 05/08/1960, 56 y.o.   MRN: [id number] Subjective: 1 Day Post-Op Procedure(s) (LRB): UNICOMPARTMENTAL LEFT KNEE (Left)    Patient reports pain as mild.  Doing well, no events overnight. Ready to make this knee good.  Questions about meds reviewed  Objective:   VITALS:   Vitals:   07/15/16 0206 07/15/16 0543  BP: (!) 96/45 (!) 127/59  Pulse: 65 86  Resp: 16   Temp: 98.3 F (36.8 C) 98.7 F (37.1 C)    Neurovascular intact Incision: dressing C/D/I  LABS  Recent Labs  07/15/16 0457  HGB 13.1  HCT 37.5  WBC 10.2  PLT 288     Recent Labs  07/15/16 0457  NA 137  K 3.8  BUN 19  CREATININE 0.56  GLUCOSE 155*    No results for input(s): LABPT, INR in the last 72 hours.   Assessment/Plan: 1 Day Post-Op Procedure(s) (LRB): UNICOMPARTMENTAL LEFT KNEE (Left)   Advance diet Up with therapy   Home today, try and set up PT at office Friday ASA 81 mg BID Reviewed goals and post-op plan

## 2016-07-15 NOTE — Evaluation (Signed)
Physical Therapy Evaluation Patient Details Name: Kim Brock MRN: [id number] DOB: 04-Sep-1960 Today's Date: 07/15/2016   History of Present Illness  s/p L UKR  Clinical Impression  Pt is ready to DC home from PT standpoint. She ambulated 55' with RW without loss of balance, completed stair training, and demonstrates understanding of HEP.     Follow Up Recommendations Outpatient PT    Equipment Recommendations  None recommended by PT    Recommendations for Other Services       Precautions / Restrictions Precautions Precautions: Knee;Fall Precaution Comments: reviewed no pillow under knee Restrictions Weight Bearing Restrictions: No Other Position/Activity Restrictions: WBAT      Mobility  Bed Mobility Overal bed mobility: Modified Independent             General bed mobility comments: HOB raised  Transfers Overall transfer level: Needs assistance Equipment used: Rolling walker (2 wheeled) Transfers: Sit to/from Stand Sit to Stand: Supervision         General transfer comment: cues for UE/LE placement  Ambulation/Gait Ambulation/Gait assistance: Supervision Ambulation Distance (Feet): 110 Feet Assistive device: Rolling walker (2 wheeled) Gait Pattern/deviations: Step-to pattern;Decreased stride length   Gait velocity interpretation: Below normal speed for age/gender General Gait Details: steady with RW, no LOB, good sequencing  Stairs Stairs: Yes Stairs assistance: Min guard Stair Management: Step to pattern;Backwards;Forwards;With walker;With crutches Number of Stairs: 5 General stair comments: instructed pt in backwards with RW technique, and forwards with B crutches technique, pt prefers crutches, VCs sequencing  Wheelchair Mobility    Modified Rankin (Stroke Patients Only)       Balance                                             Pertinent Vitals/Pain Pain Assessment: 0-10 Pain Score: 2  Pain Location: L  knee Pain Descriptors / Indicators: Sore Pain Intervention(s): Limited activity within patient's tolerance;Monitored during session;Premedicated before session;Ice applied    Home Living Family/patient expects to be discharged to:: Private residence Living Arrangements: Alone Available Help at Discharge: Family;Friend(s) (daughter to stay with pt 24* for a few days, then friends/neighbors can assist prn) Type of Home: House Home Access: Stairs to enter Entrance Stairs-Rails: None Entrance Stairs-Number of Steps: 4 Home Layout: One level Home Equipment: Media planner - 2 wheels (bariatric RW) Additional Comments: help til Thursday then intermittent    Prior Function Level of Independence: Independent with assistive device(s)         Comments: used crutches and RW     Hand Dominance        Extremity/Trunk Assessment   Upper Extremity Assessment Upper Extremity Assessment: Defer to OT evaluation    Lower Extremity Assessment Lower Extremity Assessment: LLE deficits/detail LLE Deficits / Details: 5-95* AAROM L knee, SLR 3/5, knee ext 3/5    Cervical / Trunk Assessment Cervical / Trunk Assessment: Normal  Communication   Communication: No difficulties  Cognition Arousal/Alertness: Awake/alert Behavior During Therapy: WFL for tasks assessed/performed Overall Cognitive Status: Within Functional Limits for tasks assessed                                        General Comments      Exercises Total Joint Exercises Ankle Circles/Pumps: AROM;Both;10 reps;Supine Quad Sets: AROM;Left;5 reps;Supine Short  Arc Quad: AROM;Left;10 reps;Supine Heel Slides: AAROM;Left;10 reps;Supine Hip ABduction/ADduction: AROM;Left;10 reps;Supine Straight Leg Raises: AROM;Left;10 reps;Supine Long Arc Quad: AROM;Left;5 reps;Seated Knee Flexion: AROM;AAROM;Left;5 reps;Seated Goniometric ROM: 5-95* AAROM L knee   Assessment/Plan    PT Assessment All further  PT needs can be met in the next venue of care  PT Problem List         PT Treatment Interventions      PT Goals (Current goals can be found in the Care Plan section)  Acute Rehab PT Goals 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 : None Difficulty sitting down on and standing up from a chair with arms (e.g., wheelchair, bedside commode, etc,.)?: None Help needed moving to and from a bed to chair (including a wheelchair)?: A Little Help needed walking in hospital room?: None Help needed climbing 3-5 steps with a railing? : A Little 6 Click Score: 22    End of Session Equipment Utilized During Treatment: Gait belt Activity Tolerance: Patient tolerated treatment well Patient left: in chair;with call bell/phone within reach Nurse Communication: Mobility status      Time: 3744-5146 PT Time Calculation (min) (ACUTE ONLY): 43 min   Charges:   PT Evaluation $PT Eval Low Complexity: 1 Procedure PT Treatments $Gait Training: 8-22 mins $Therapeutic Exercise: 8-22 mins   PT G Codes:   PT G-Codes **NOT FOR INPATIENT CLASS** Functional Assessment Tool Used: AM-PAC 6 Clicks Basic Mobility Functional Limitation: Mobility: Walking and moving around Mobility: Walking and Moving Around Current Status ([id number]): At least 1 percent but less than 20 percent impaired, limited or restricted Mobility: Walking and Moving Around Goal Status [phone]): At least 1 percent but less than 20 percent impaired, limited or restricted      Philomena Doheny 07/15/2016, 10:22 AM [phone]

## 2016-07-15 NOTE — Evaluation (Signed)
Occupational Therapy Evaluation Patient Details Name: Kim Brock MRN: [id number] DOB: [birth year] Today's Date: 07/15/2016    History of Present Illness s/p L UKR   Clinical Impression   This 56 year old female was admitted for the above sx. All education was completed. No further OT is needed at this time    Follow Up Recommendations  No OT follow up    Equipment Recommendations  None recommended by OT (pt does not want 3:1)    Recommendations for Other Services       Precautions / Restrictions Precautions Precautions: Knee;Fall Restrictions Weight Bearing Restrictions: No      Mobility Bed Mobility Overal bed mobility: Modified Independent             General bed mobility comments: HOB raised  Transfers Overall transfer level: Needs assistance Equipment used: Rolling walker (2 wheeled) Transfers: Sit to/from Stand Sit to Stand: Supervision         General transfer comment: cues for UE/LE placement    Balance                                           ADL either performed or assessed with clinical judgement   ADL Overall ADL's : Needs assistance/impaired     Grooming: Oral care;Wash/dry hands;Wash/dry face;Standing;Supervision/safety   Upper Body Bathing: Supervision/ safety;Standing   Lower Body Bathing: Supervison/ safety;Sit to/from stand   Upper Body Dressing : Set up;Sitting   Lower Body Dressing: Minimal assistance;Sit to/from stand (ted hose)   Toilet Transfer: Supervision/safety;Ambulation;RW;Comfort height toilet   Toileting- Clothing Manipulation and Hygiene: Supervision/safety;Sit to/from stand         General ADL Comments: simulated shower.  Pt has her own way of getting in. Doesn't feel safe stepping in backwards due to balance.  L knee was her "good knee" prior to injury.  Pt is an Charity fundraiser.  cues for safety during session as she tends to reach far out of BOS. She is very independent natured. Reviewed  precautions     Vision         Perception     Praxis      Pertinent Vitals/Pain Pain Assessment: 0-10 Pain Score: 2  Pain Location: thigh and quad Pain Descriptors / Indicators: Sore Pain Intervention(s): Limited activity within patient's tolerance;Monitored during session;Premedicated before session;Repositioned;Ice applied     Hand Dominance     Extremity/Trunk Assessment Upper Extremity Assessment Upper Extremity Assessment: Overall WFL for tasks assessed           Communication Communication Communication: No difficulties   Cognition Arousal/Alertness: Awake/alert Behavior During Therapy: WFL for tasks assessed/performed Overall Cognitive Status: Within Functional Limits for tasks assessed                                     General Comments       Exercises     Shoulder Instructions      Home Living Family/patient expects to be discharged to:: Private residence Living Arrangements: Alone Available Help at Discharge: Family               Bathroom Shower/Tub: Walk-in shower   Bathroom Toilet: Standard     Home Equipment: Environmental consultant - 2 wheels;Crutches   Additional Comments: help til Thursday then intermittent      Prior  Functioning/Environment Level of Independence: Independent                 OT Problem List:        OT Treatment/Interventions:      OT Goals(Current goals can be found in the care plan section) Acute Rehab OT Goals Patient Stated Goal: return to independence OT Goal Formulation: All assessment and education complete, DC therapy  OT Frequency:     Barriers to D/C:            Co-evaluation              AM-PAC PT "6 Clicks" Daily Activity     Outcome Measure Help from another person eating meals?: None Help from another person taking care of personal grooming?: A Little Help from another person toileting, which includes using toliet, bedpan, or urinal?: A Little Help from another person  bathing (including washing, rinsing, drying)?: A Little Help from another person to put on and taking off regular upper body clothing?: A Little Help from another person to put on and taking off regular lower body clothing?: A Little 6 Click Score: 19   End of Session    Activity Tolerance: Patient tolerated treatment well Patient left: in bed;with call bell/phone within reach  OT Visit Diagnosis: Muscle weakness (generalized) (M62.81)                Time: [id number] OT Time Calculation (min): 40 min Charges:  OT General Charges $OT Visit: 1 Procedure OT Evaluation $OT Eval Low Complexity: 1 Procedure OT Treatments $Self Care/Home Management : 8-22 mins G-Codes: OT G-codes **NOT FOR INPATIENT CLASS** Functional Assessment Tool Used: AM-PAC 6 Clicks Daily Activity;Clinical judgement Functional Limitation: Self care Self Care Current Status ([id number]): At least 1 percent but less than 20 percent impaired, limited or restricted Self Care Goal Status (J1914(G8988): At least 1 percent but less than 20 percent impaired, limited or restricted Self Care Discharge Status [phone](G8989): At least 1 percent but less than 20 percent impaired, limited or restricted   Kim Brock, OTR/L [phone] 07/15/2016  Kim Brock 07/15/2016, 8:23 AM

## 2016-07-15 NOTE — Progress Notes (Signed)
Discharge planning, no HH needs identified. Plans for OP PT, daughter to assist at home, has needed DME. [phone]

## 2016-07-15 NOTE — Progress Notes (Signed)
Nursing Discharge Summary  Patient ID: Kim Brock MRN: [id number] DOB/AGE: Sep 24, 1960 56 y.o.  Admit date: 07/14/2016 Discharge date: 07/15/2016  Discharged Condition: good  Disposition: 01-Home or Self Care  Follow-up Information    Durene Romanslin, Matthew, MD. Schedule an appointment as soon as possible for a visit in 2 week(s).   Specialty:  Orthopedic Surgery Contact information: [address] [place] [address] [phone]           Prescriptions Given: Prescriptions given for aspirin, robaxin, vicodin, and Celbrex.  Reviewed medications and discussed follow up appointments. Patient verbalized understanding without further questions.    Means of Discharge: Patient to be taken downstairs via wheelchair to be discharged home.   Signed: Gloriajean DellBaldwin, Nikolai Wilczak Danielle 07/15/2016, 11:44 AM

## 2016-07-18 DIAGNOSIS — M25662 Stiffness of left knee, not elsewhere classified: Secondary | ICD-10-CM | POA: Diagnosis not present

## 2016-07-20 NOTE — Discharge Summary (Signed)
Physician Discharge Summary  Patient ID: Kim Brock MRN: [id number] DOB/AGE: [birth year] 56 y.o.  Admit date: 07/14/2016 Discharge date: 07/15/2016   Procedures:  Procedure(s) (LRB): UNICOMPARTMENTAL LEFT KNEE (Left)  Attending Physician:  Dr. Durene Romans   Admission Diagnoses:   Left knee medial compartmental primary OA /pain  Discharge Diagnoses:  Principal Problem:   S/P left UKR  Past Medical History:  Diagnosis Date  . Allergic rhinitis   . Anxiety   . Arthritis    oa both knees  . Asthma   . Bronchospasm    With URI  . Depression   . Depression with anxiety   . Diabetes mellitus without complication (HCC)    diet controlled  . Hypertension   . Morbid obesity with BMI of 50.0-59.9, adult (HCC)    BMI 59.3  . Morbid obesity with BMI of 50.0-59.9, adult (HCC)   . Near sighted    Wears contacts  . OSA on CPAP    on auto CPAP for life  . PONV (postoperative nausea and vomiting)    nausea likes zofran, pt has motion sickness  . Vitamin D deficiency     HPI:    Kim Brock, 56 y.o. female female, has a history of pain and functional disability in the left and has failed non-surgical conservative treatments for greater than 12 weeks to include NSAID's and/or analgesics, corticosteriod injections and activity modification.  Onset of symptoms was gradual, starting  years ago with gradually worsening course since that time.   Patient currently rates pain in the left knee(s) at 10 out of 10 with activity. Patient has night pain, worsening of pain with activity and weight bearing, pain that interferes with activities of daily living, pain with passive range of motion, crepitus and joint swelling.  Patient has evidence of periarticular osteophytes and joint space narrowing of the medial compartment by imaging studies.  There is no active infection.  Risks, benefits and expectations were discussed with the patient.  Risks including but not limited to the risk of  anesthesia, blood clots, nerve damage, blood vessel damage, failure of the prosthesis, infection and up to and including death.  Patient understand the risks, benefits and expectations and wishes to proceed with surgery.   PCP: Laurann Montana, MD   Discharged Condition: good  Hospital Course:  Patient underwent the above stated procedure on 07/14/2016. Patient tolerated the procedure well and brought to the recovery room in good condition and subsequently to the floor.  POD #1 BP: 127/59 ; Pulse: 86 ; Temp: 98.7 F (37.1 C) ; Resp: 16 Patient reports pain as mild.  Doing well, no events overnight. Ready to make this knee good.  Questions about meds reviewed. Neurovascular intact and incision: dressing C/D/I.  LABS  Basename    HGB     13.1  HCT     37.5    Discharge Exam: General appearance: alert, cooperative and no distress Extremities: Homans sign is negative, no sign of DVT, no edema, redness or tenderness in the calves or thighs and no ulcers, gangrene or trophic changes  Disposition: Home with follow up in 2 weeks   Follow-up Information    Durene Romans, MD. Schedule an appointment as soon as possible for a visit in 2 week(s).   Specialty:  Orthopedic Surgery Contact information: [address] [place] [address] [phone]           Discharge Instructions    Call MD / Call 911  Complete by:  As directed    If you experience chest pain or shortness of breath, CALL 911 and be transported to the hospital emergency room.  If you develope a fever above 101 F, pus (white drainage) or increased drainage or redness at the wound, or calf pain, call your surgeon's office.   Change dressing    Complete by:  As directed    Maintain surgical dressing until follow up in the clinic. If the edges start to pull up, may reinforce with tape. If the dressing is no longer working, may remove and cover with gauze and tape, but must keep the area dry and clean.   Call with any questions or concerns.   Constipation Prevention    Complete by:  As directed    Drink plenty of fluids.  Prune juice may be helpful.  You may use a stool softener, such as Colace (over the counter) 100 mg twice a day.  Use MiraLax (over the counter) for constipation as needed.   Diet - low sodium heart healthy    Complete by:  As directed    Discharge instructions    Complete by:  As directed    Maintain surgical dressing until follow up in the clinic. If the edges start to pull up, may reinforce with tape. If the dressing is no longer working, may remove and cover with gauze and tape, but must keep the area dry and clean.  Follow up in 2 weeks at [facility]. Call with any questions or concerns.   Increase activity slowly as tolerated    Complete by:  As directed    Weight bearing as tolerated with assist device (walker, cane, etc) as directed, use it as long as suggested by your surgeon or therapist, typically at least 4-6 weeks.   TED hose    Complete by:  As directed    Use stockings (TED hose) for 2 weeks on both leg(s).  You may remove them at night for sleeping.      Allergies as of 07/15/2016      Reactions   Chlorhexidine Dermatitis   Demerol [meperidine] Other (See Comments)   Blood pressure drops and makes me violently nauseous.   Epinephrine Other (See Comments)   In Novocaine - makes jittery and heart race   Percocet [oxycodone-acetaminophen]    Hallucinate, Family h/o of poor tolerance.  Will not take - can tolerate Vicodin   Tape Other (See Comments)   Steri strips - blisters      Medication List    STOP taking these medications   diclofenac 75 MG EC tablet Commonly known as:  VOLTAREN   mupirocin ointment 2 % Commonly known as:  BACTROBAN     TAKE these medications   albuterol 108 (90 Base) MCG/ACT inhaler Commonly known as:  PROVENTIL HFA;VENTOLIN HFA Inhale 2 puffs into the lungs every 4 (four) hours as needed for shortness of  breath.   amLODipine 10 MG tablet Commonly known as:  NORVASC Take 10 mg by mouth daily.   aspirin 81 MG EC tablet Take 1 tablet (81 mg total) by mouth 2 (two) times daily. Take for 4 weeks.   celecoxib 200 MG capsule Commonly known as:  CELEBREX Take 1 capsule (200 mg total) by mouth every 12 (twelve) hours.   docusate sodium 100 MG capsule Commonly known as:  COLACE Take 1 capsule (100 mg total) by mouth 2 (two) times daily.   escitalopram 10 MG tablet Commonly known as:  LEXAPRO Take 30 mg by mouth daily.   ferrous sulfate 325 (65 FE) MG tablet Take 1 tablet (325 mg total) by mouth 3 (three) times daily after meals.   HYDROcodone-acetaminophen 10-325 MG tablet Commonly known as:  NORCO Take 0.5-1 tablets by mouth every 6 (six) hours as needed for moderate pain. What changed:  when to take this   KLONOPIN 0.5 MG tablet Generic drug:  clonazePAM Take 0.25-0.5 mg by mouth at bedtime as needed for anxiety.   lisinopril-hydrochlorothiazide 20-25 MG tablet Commonly known as:  PRINZIDE,ZESTORETIC Take 1 tablet by mouth daily.   loratadine 10 MG tablet Commonly known as:  CLARITIN Take 10 mg by mouth daily as needed for allergies.   methocarbamol 500 MG tablet Commonly known as:  ROBAXIN Take 1 tablet (500 mg total) by mouth every 6 (six) hours as needed for muscle spasms.   multivitamin with minerals Tabs tablet Take 1 tablet by mouth daily.   polyethylene glycol packet Commonly known as:  MIRALAX / GLYCOLAX Take 17 g by mouth 2 (two) times daily.   QVAR 80 MCG/ACT inhaler Generic drug:  beclomethasone Inhale 2 puffs into the lungs 2 (two) times daily.        Signed: Anastasio AuerbachMatthew S. Tereza Gilham   PA-C  07/20/2016, 12:07 PM

## 2016-07-21 DIAGNOSIS — M25662 Stiffness of left knee, not elsewhere classified: Secondary | ICD-10-CM | POA: Diagnosis not present

## 2016-07-24 DIAGNOSIS — M25662 Stiffness of left knee, not elsewhere classified: Secondary | ICD-10-CM | POA: Diagnosis not present

## 2016-07-28 DIAGNOSIS — M25662 Stiffness of left knee, not elsewhere classified: Secondary | ICD-10-CM | POA: Diagnosis not present

## 2016-08-01 DIAGNOSIS — M25662 Stiffness of left knee, not elsewhere classified: Secondary | ICD-10-CM | POA: Diagnosis not present

## 2016-08-04 DIAGNOSIS — M25662 Stiffness of left knee, not elsewhere classified: Secondary | ICD-10-CM | POA: Diagnosis not present

## 2016-08-07 DIAGNOSIS — M25662 Stiffness of left knee, not elsewhere classified: Secondary | ICD-10-CM | POA: Diagnosis not present

## 2016-08-11 ENCOUNTER — Ambulatory Visit
Admission: RE | Admit: 2016-08-11 | Discharge: 2016-08-11 | Disposition: A | Discharge status: Home / Self Care | Payer: 59 | Source: Ambulatory Visit | Attending: Obstetrics & Gynecology | Admitting: Obstetrics & Gynecology

## 2016-08-11 DIAGNOSIS — Z1231 Encounter for screening mammogram for malignant neoplasm of breast: Secondary | ICD-10-CM | POA: Diagnosis not present

## 2016-08-11 DIAGNOSIS — M25662 Stiffness of left knee, not elsewhere classified: Secondary | ICD-10-CM | POA: Diagnosis not present

## 2016-08-12 DIAGNOSIS — Z0000 Encounter for general adult medical examination without abnormal findings: Secondary | ICD-10-CM | POA: Diagnosis not present

## 2016-08-12 DIAGNOSIS — I10 Essential (primary) hypertension: Secondary | ICD-10-CM | POA: Diagnosis not present

## 2016-08-12 DIAGNOSIS — E119 Type 2 diabetes mellitus without complications: Secondary | ICD-10-CM | POA: Diagnosis not present

## 2016-08-12 DIAGNOSIS — B079 Viral wart, unspecified: Secondary | ICD-10-CM | POA: Diagnosis not present

## 2016-08-12 DIAGNOSIS — Z23 Encounter for immunization: Secondary | ICD-10-CM | POA: Diagnosis not present

## 2016-08-14 DIAGNOSIS — M25662 Stiffness of left knee, not elsewhere classified: Secondary | ICD-10-CM | POA: Diagnosis not present

## 2016-08-18 DIAGNOSIS — M25662 Stiffness of left knee, not elsewhere classified: Secondary | ICD-10-CM | POA: Diagnosis not present

## 2016-08-25 DIAGNOSIS — M25662 Stiffness of left knee, not elsewhere classified: Secondary | ICD-10-CM | POA: Diagnosis not present

## 2016-08-27 DIAGNOSIS — Z96652 Presence of left artificial knee joint: Secondary | ICD-10-CM | POA: Diagnosis not present

## 2016-09-05 DIAGNOSIS — B079 Viral wart, unspecified: Secondary | ICD-10-CM | POA: Diagnosis not present

## 2016-09-19 DIAGNOSIS — B079 Viral wart, unspecified: Secondary | ICD-10-CM | POA: Diagnosis not present

## 2016-10-27 ENCOUNTER — Encounter (HOSPITAL_COMMUNITY): Payer: Self-pay

## 2017-01-15 DIAGNOSIS — Z471 Aftercare following joint replacement surgery: Secondary | ICD-10-CM | POA: Diagnosis not present

## 2017-01-15 DIAGNOSIS — Z96652 Presence of left artificial knee joint: Secondary | ICD-10-CM | POA: Diagnosis not present

## 2017-02-12 DIAGNOSIS — E119 Type 2 diabetes mellitus without complications: Secondary | ICD-10-CM | POA: Diagnosis not present

## 2017-02-12 DIAGNOSIS — I10 Essential (primary) hypertension: Secondary | ICD-10-CM | POA: Diagnosis not present

## 2017-04-02 DIAGNOSIS — Z8052 Family history of malignant neoplasm of bladder: Secondary | ICD-10-CM | POA: Diagnosis not present

## 2017-04-02 DIAGNOSIS — Z8371 Family history of colonic polyps: Secondary | ICD-10-CM | POA: Diagnosis not present

## 2017-04-02 DIAGNOSIS — Z86010 Personal history of colonic polyps: Secondary | ICD-10-CM | POA: Diagnosis not present

## 2017-04-02 DIAGNOSIS — Z01419 Encounter for gynecological examination (general) (routine) without abnormal findings: Secondary | ICD-10-CM | POA: Diagnosis not present

## 2017-04-23 DIAGNOSIS — M9903 Segmental and somatic dysfunction of lumbar region: Secondary | ICD-10-CM | POA: Diagnosis not present

## 2017-04-23 DIAGNOSIS — M25552 Pain in left hip: Secondary | ICD-10-CM | POA: Diagnosis not present

## 2017-04-23 DIAGNOSIS — M545 Low back pain: Secondary | ICD-10-CM | POA: Diagnosis not present

## 2017-05-07 DIAGNOSIS — Z809 Family history of malignant neoplasm, unspecified: Secondary | ICD-10-CM | POA: Diagnosis not present

## 2017-05-29 DIAGNOSIS — M9903 Segmental and somatic dysfunction of lumbar region: Secondary | ICD-10-CM | POA: Diagnosis not present

## 2017-05-29 DIAGNOSIS — M25552 Pain in left hip: Secondary | ICD-10-CM | POA: Diagnosis not present

## 2017-05-29 DIAGNOSIS — E119 Type 2 diabetes mellitus without complications: Secondary | ICD-10-CM | POA: Diagnosis not present

## 2017-05-29 DIAGNOSIS — M545 Low back pain: Secondary | ICD-10-CM | POA: Diagnosis not present

## 2017-07-20 DIAGNOSIS — J0100 Acute maxillary sinusitis, unspecified: Secondary | ICD-10-CM | POA: Diagnosis not present

## 2017-07-20 DIAGNOSIS — S8011XA Contusion of right lower leg, initial encounter: Secondary | ICD-10-CM | POA: Diagnosis not present

## 2017-07-20 DIAGNOSIS — J4521 Mild intermittent asthma with (acute) exacerbation: Secondary | ICD-10-CM | POA: Diagnosis not present

## 2017-07-27 ENCOUNTER — Ambulatory Visit: Payer: 59 | Admitting: Cardiology

## 2017-07-27 ENCOUNTER — Telehealth: Payer: Self-pay | Admitting: *Deleted

## 2017-07-27 ENCOUNTER — Encounter: Payer: Self-pay | Admitting: Cardiology

## 2017-07-27 VITALS — BP 124/78 | HR 74 | Ht 64.75 in | Wt 291.1 lb

## 2017-07-27 DIAGNOSIS — Z9989 Dependence on other enabling machines and devices: Secondary | ICD-10-CM

## 2017-07-27 DIAGNOSIS — G4733 Obstructive sleep apnea (adult) (pediatric): Secondary | ICD-10-CM | POA: Diagnosis not present

## 2017-07-27 DIAGNOSIS — I10 Essential (primary) hypertension: Secondary | ICD-10-CM

## 2017-07-27 DIAGNOSIS — Z6841 Body Mass Index (BMI) 40.0 and over, adult: Secondary | ICD-10-CM

## 2017-07-27 NOTE — Telephone Encounter (Signed)
Resmed Nasal pillow mask with chin strap  [facility].  Patient can not pick up today supplies will be mailed to patient.

## 2017-07-27 NOTE — Telephone Encounter (Signed)
-----   Message from Phineas Semenonnisha Robertson, RN sent at 07/27/2017 10:22 AM EDT ----- Regarding: dme order  Dme order placed. Would like to pick up at store today   Thanks Rena

## 2017-07-27 NOTE — Patient Instructions (Signed)
Medication Instructions:  Your physician recommends that you continue on your current medications as directed. Please refer to the Current Medication list given to you today.  If you need a refill on your cardiac medications, please contact your pharmacy first.  Labwork: None ordered   Testing/Procedures: None ordered   Follow-Up: Your physician wants you to follow-up in: 1 year with Dr. Turner. You will receive a reminder letter in the mail two months in advance. If you don't receive a letter, please call our office to schedule the follow-up appointment.  Any Other Special Instructions Will Be Listed Below (If Applicable).   Thank you for choosing [facility]    Rena Jordane Hisle, RN  [phone]  If you need a refill on your cardiac medications before your next appointment, please call your pharmacy.   

## 2017-07-27 NOTE — Progress Notes (Signed)
Cardiology Office Note:    Date:  07/27/2017   ID:  ROSLAND RIDING, DOB [birth year], MRN [id number]  PCP:  Laurann Montana, MD  Cardiologist:  No primary care provider on file.    Referring MD: Laurann Montana, MD   No chief complaint on file.   History of Present Illness:    Kim Brock is a 57 y.o. female with a hx of  OSA, HTN and morbid obesity.  She is doing well with her CPAP device.  She tolerates the mask and feels the pressure is adequate.  Since going on CPAP She feels rested in the am and has no significant daytime sleepiness.  She denies any significant mouth or nasal dryness or nasal congestion.  She does not think that he snores.      Past Medical History:  Diagnosis Date  . Allergic rhinitis   . Anxiety   . Arthritis    oa both knees  . Asthma   . Bronchospasm    With URI  . Depression   . Depression with anxiety   . Diabetes mellitus without complication (HCC)    diet controlled  . Hypertension   . Morbid obesity with BMI of 50.0-59.9, adult (HCC)    BMI 59.3  . Morbid obesity with BMI of 50.0-59.9, adult (HCC)   . Near sighted    Wears contacts  . OSA on CPAP    on auto CPAP for life  . PONV (postoperative nausea and vomiting)    nausea likes zofran, pt has motion sickness  . Vitamin D deficiency     Past Surgical History:  Procedure Laterality Date  . CHOLECYSTECTOMY  1999  . DILATION AND CURETTAGE OF UTERUS  2011  . KNEE SURGERY  2009/2010   left and right  knees arthroscopic meniscal repair  . LAPAROSCOPIC GASTRIC BANDING N/A 03/30/2012   Procedure: LAPAROSCOPIC GASTRIC BANDING;  Surgeon: Atilano Ina, MD,FACS;  Location: [facility];  Service: General;  Laterality: N/A;  Laparoscopic Adjustable Gastric Band Placement,   . MESH APPLIED TO LAP PORT N/A 03/30/2012   Procedure: MESH APPLIED TO LAP PORT;  Surgeon: Atilano Ina, MD,FACS;  Location: [facility];  Service: General;  Laterality: N/A;  . PARTIAL KNEE ARTHROPLASTY Left 07/14/2016   Procedure: UNICOMPARTMENTAL LEFT KNEE;  Surgeon: Durene Romans, MD;  Location: [facility];  Service: Orthopedics;  Laterality: Left;  . TUBAL LIGATION  2001    Current Medications: Current Meds  Medication Sig  . albuterol (PROVENTIL HFA;VENTOLIN HFA) 108 (90 BASE) MCG/ACT inhaler Inhale 2 puffs into the lungs every 4 (four) hours as needed for shortness of breath.   Marland Kitchen amLODipine (NORVASC) 10 MG tablet Take 10 mg by mouth daily.  . clonazePAM (KLONOPIN) 0.5 MG tablet Take 0.25-0.5 mg by mouth at bedtime as needed for anxiety.   . diclofenac (VOLTAREN) 75 MG EC tablet Take 75 mg by mouth 2 (two) times daily.  Marland Kitchen escitalopram (LEXAPRO) 10 MG tablet Take 30 mg by mouth daily.   Marland Kitchen HYDROcodone-acetaminophen (NORCO) 10-325 MG tablet Take 0.5-1 tablets by mouth every 6 (six) hours as needed for moderate pain.  Marland Kitchen lisinopril-hydrochlorothiazide (PRINZIDE,ZESTORETIC) 20-25 MG tablet Take 1 tablet by mouth daily.  Marland Kitchen loratadine (CLARITIN) 10 MG tablet Take 10 mg by mouth daily as needed for allergies.  Marland Kitchen QVAR 80 MCG/ACT inhaler Inhale 2 puffs into the lungs 2 (two) times daily.      Allergies:   Chlorhexidine; Demerol [meperidine]; Epinephrine; Percocet [oxycodone-acetaminophen]; and Tape  Social History   Socioeconomic History  . Marital status: Single    Spouse name: Not on file  . Number of children: Not on file  . Years of education: Not on file  . Highest education level: Not on file  Occupational History  . Not on file  Social Needs  . Financial resource strain: Not on file  . Food insecurity:    Worry: Not on file    Inability: Not on file  . Transportation needs:    Medical: Not on file    Non-medical: Not on file  Tobacco Use  . Smoking status: Former Smoker    Packs/day: 1.00    Years: 11.00    Pack years: 11.00    Types: Cigarettes    Last attempt to quit: 08/06/1991    Years since quitting: 25.9  . Smokeless tobacco: Never Used  Substance and Sexual Activity  . Alcohol use:  Yes    Alcohol/week: 0.6 - 1.2 oz    Types: 1 - 2 Glasses of wine per week    Comment: 3 drinks per week  . Drug use: No  . Sexual activity: Not on file  Lifestyle  . Physical activity:    Days per week: Not on file    Minutes per session: Not on file  . Stress: Not on file  Relationships  . Social connections:    Talks on phone: Not on file    Gets together: Not on file    Attends religious service: Not on file    Active member of club or organization: Not on file    Attends meetings of clubs or organizations: Not on file    Relationship status: Not on file  Other Topics Concern  . Not on file  Social History Narrative  . Not on file     Family History: The patient's family history includes Breast cancer (age of onset: 6174) in her mother; Cancer in her paternal grandmother; Diabetes in her mother; Hyperlipidemia in her father; Hypertension in her mother; Osteoarthritis in her mother.  ROS:   Please see the history of present illness.    ROS  All other systems reviewed and negative.   EKGs/Labs/Other Studies Reviewed:    The following studies were reviewed today: PAP download  EKG:  EKG is not ordered today.    Recent Labs: No results found for requested labs within last 8760 hours.   Recent Lipid Panel    Component Value Date/Time   CHOL 164 01/06/2012 0925   TRIG 157 (H) 01/06/2012 0925   HDL 62 01/06/2012 0925   CHOLHDL 2.6 01/06/2012 0925   VLDL 31 01/06/2012 0925   LDLCALC 71 01/06/2012 0925    Physical Exam:    VS:  Ht 5' 4.75" (1.645 m)   Wt 291 lb 1.9 oz (132.1 kg)   BMI 48.82 kg/m     Wt Readings from Last 3 Encounters:  07/27/17 291 lb 1.9 oz (132.1 kg)  07/14/16 (!) 307 lb (139.3 kg)  07/11/16 (!) 307 lb (139.3 kg)     GEN:  Well nourished, well developed in no acute distress HEENT: Normal NECK: No JVD; No carotid bruits LYMPHATICS: No lymphadenopathy CARDIAC: RRR, no murmurs, rubs, gallops RESPIRATORY:  Clear to auscultation without  rales, wheezing or rhonchi  ABDOMEN: Soft, non-tender, non-distended MUSCULOSKELETAL:  No edema; No deformity  SKIN: Warm and dry NEUROLOGIC:  Alert and oriented x 3 PSYCHIATRIC:  Normal affect   ASSESSMENT:    1. OSA  on CPAP   2. Essential hypertension   3. Morbid obesity with BMI of 50.0-59.9, adult (HCC)    PLAN:    In order of problems listed above:  1.  OSA - the patient is tolerating PAP therapy well without any problems. The PAP download was reviewed today and showed an AHI of 4.1/hr on auto PAP with 99% compliance in using more than 4 hours nightly.  The patient has been using and benefiting from PAP use and will continue to benefit from therapy.  Send an order for new Pap supplies as well as new ResMed nasal pillow mask.  2.  HTN - BP is well controlled on exam today.  She will continue on Prinizide 20-25mg  daily, amlodipine 10mg  daily.  3.  Morbid Obesity - I have encouraged her to get into a routine exercise program and cut back on carbs and portions.    Medication Adjustments/Labs and Tests Ordered: Current medicines are reviewed at length with the patient today.  Concerns regarding medicines are outlined above.  No orders of the defined types were placed in this encounter.  No orders of the defined types were placed in this encounter.   Signed, Armanda Magic, MD  07/27/2017 9:58 AM    [facility]

## 2017-08-20 DIAGNOSIS — Z0000 Encounter for general adult medical examination without abnormal findings: Secondary | ICD-10-CM | POA: Diagnosis not present

## 2017-08-20 DIAGNOSIS — E119 Type 2 diabetes mellitus without complications: Secondary | ICD-10-CM | POA: Diagnosis not present

## 2017-08-20 DIAGNOSIS — Z23 Encounter for immunization: Secondary | ICD-10-CM | POA: Diagnosis not present

## 2017-08-20 DIAGNOSIS — I10 Essential (primary) hypertension: Secondary | ICD-10-CM | POA: Diagnosis not present

## 2017-09-08 DIAGNOSIS — M9903 Segmental and somatic dysfunction of lumbar region: Secondary | ICD-10-CM | POA: Diagnosis not present

## 2017-09-08 DIAGNOSIS — M25552 Pain in left hip: Secondary | ICD-10-CM | POA: Diagnosis not present

## 2017-09-08 DIAGNOSIS — M545 Low back pain: Secondary | ICD-10-CM | POA: Diagnosis not present

## 2017-09-18 DIAGNOSIS — M25552 Pain in left hip: Secondary | ICD-10-CM | POA: Diagnosis not present

## 2017-09-18 DIAGNOSIS — M9903 Segmental and somatic dysfunction of lumbar region: Secondary | ICD-10-CM | POA: Diagnosis not present

## 2017-09-18 DIAGNOSIS — M545 Low back pain: Secondary | ICD-10-CM | POA: Diagnosis not present

## 2017-09-29 ENCOUNTER — Other Ambulatory Visit: Payer: Self-pay | Admitting: Obstetrics & Gynecology

## 2017-09-29 DIAGNOSIS — Z1231 Encounter for screening mammogram for malignant neoplasm of breast: Secondary | ICD-10-CM

## 2017-10-14 DIAGNOSIS — M9903 Segmental and somatic dysfunction of lumbar region: Secondary | ICD-10-CM | POA: Diagnosis not present

## 2017-10-14 DIAGNOSIS — M545 Low back pain: Secondary | ICD-10-CM | POA: Diagnosis not present

## 2017-10-14 DIAGNOSIS — M25552 Pain in left hip: Secondary | ICD-10-CM | POA: Diagnosis not present

## 2017-10-26 ENCOUNTER — Ambulatory Visit
Admission: RE | Admit: 2017-10-26 | Discharge: 2017-10-26 | Disposition: A | Discharge status: Home / Self Care | Payer: 59 | Source: Ambulatory Visit | Attending: Obstetrics & Gynecology | Admitting: Obstetrics & Gynecology

## 2017-10-26 DIAGNOSIS — Z1231 Encounter for screening mammogram for malignant neoplasm of breast: Secondary | ICD-10-CM

## 2018-01-07 DIAGNOSIS — M25552 Pain in left hip: Secondary | ICD-10-CM | POA: Diagnosis not present

## 2018-01-07 DIAGNOSIS — M545 Low back pain: Secondary | ICD-10-CM | POA: Diagnosis not present

## 2018-01-07 DIAGNOSIS — M9903 Segmental and somatic dysfunction of lumbar region: Secondary | ICD-10-CM | POA: Diagnosis not present

## 2018-01-08 DIAGNOSIS — M25561 Pain in right knee: Secondary | ICD-10-CM | POA: Diagnosis not present

## 2018-01-08 DIAGNOSIS — Z96652 Presence of left artificial knee joint: Secondary | ICD-10-CM | POA: Diagnosis not present

## 2018-01-08 DIAGNOSIS — M1711 Unilateral primary osteoarthritis, right knee: Secondary | ICD-10-CM | POA: Diagnosis not present

## 2018-01-11 DIAGNOSIS — M25552 Pain in left hip: Secondary | ICD-10-CM | POA: Diagnosis not present

## 2018-01-11 DIAGNOSIS — M9903 Segmental and somatic dysfunction of lumbar region: Secondary | ICD-10-CM | POA: Diagnosis not present

## 2018-01-11 DIAGNOSIS — M545 Low back pain: Secondary | ICD-10-CM | POA: Diagnosis not present

## 2018-01-15 DIAGNOSIS — M545 Low back pain: Secondary | ICD-10-CM | POA: Diagnosis not present

## 2018-01-15 DIAGNOSIS — M9903 Segmental and somatic dysfunction of lumbar region: Secondary | ICD-10-CM | POA: Diagnosis not present

## 2018-01-15 DIAGNOSIS — M25552 Pain in left hip: Secondary | ICD-10-CM | POA: Diagnosis not present

## 2018-01-20 DIAGNOSIS — M25552 Pain in left hip: Secondary | ICD-10-CM | POA: Diagnosis not present

## 2018-01-20 DIAGNOSIS — M545 Low back pain: Secondary | ICD-10-CM | POA: Diagnosis not present

## 2018-01-20 DIAGNOSIS — M9903 Segmental and somatic dysfunction of lumbar region: Secondary | ICD-10-CM | POA: Diagnosis not present

## 2018-03-18 ENCOUNTER — Encounter (HOSPITAL_COMMUNITY): Admission: RE | Payer: Self-pay | Source: Home / Self Care

## 2018-03-18 ENCOUNTER — Ambulatory Visit (HOSPITAL_COMMUNITY): Admission: RE | Admit: 2018-03-18 | Payer: 59 | Source: Home / Self Care | Admitting: Orthopedic Surgery

## 2018-03-18 SURGERY — ARTHROPLASTY, KNEE, UNICOMPARTMENTAL
Anesthesia: Spinal | Laterality: Right

## 2018-09-16 ENCOUNTER — Telehealth: Payer: Self-pay | Admitting: *Deleted

## 2018-09-16 ENCOUNTER — Encounter: Payer: Self-pay | Admitting: Cardiology

## 2018-09-16 ENCOUNTER — Telehealth (INDEPENDENT_AMBULATORY_CARE_PROVIDER_SITE_OTHER): Payer: Self-pay | Admitting: Cardiology

## 2018-09-16 ENCOUNTER — Other Ambulatory Visit: Payer: Self-pay

## 2018-09-16 VITALS — BP 138/78 | HR 82 | Ht 64.75 in | Wt 311.0 lb

## 2018-09-16 DIAGNOSIS — G4733 Obstructive sleep apnea (adult) (pediatric): Secondary | ICD-10-CM

## 2018-09-16 DIAGNOSIS — Z6841 Body Mass Index (BMI) 40.0 and over, adult: Secondary | ICD-10-CM

## 2018-09-16 DIAGNOSIS — Z9989 Dependence on other enabling machines and devices: Secondary | ICD-10-CM

## 2018-09-16 DIAGNOSIS — I10 Essential (primary) hypertension: Secondary | ICD-10-CM

## 2018-09-16 NOTE — Telephone Encounter (Signed)
-----   Message from Richmond Campbell, LPN sent at [phone]/8676  9:03 AM EDT ----- Please order new CPAP supplies.  Send this to [facility].Followup with me 1 year

## 2018-09-16 NOTE — Telephone Encounter (Signed)
Order placed to [facility] today. 

## 2018-09-16 NOTE — Progress Notes (Signed)
Virtual Visit via Video Note   This visit type was conducted due to national recommendations for restrictions regarding the COVID-19 Pandemic (e.g. social distancing) in an effort to limit this patient's exposure and mitigate transmission in our community.  Due to her co-morbid illnesses, this patient is at least at moderate risk for complications without adequate follow up.  This format is felt to be most appropriate for this patient at this time.  All issues noted in this document were discussed and addressed.  A limited physical exam was performed with this format.  Please refer to the patient's chart for her consent to telehealth for [facility].  Evaluation Performed:  Follow-up visit  This visit type was conducted due to national recommendations for restrictions regarding the COVID-19 Pandemic (e.g. social distancing).  This format is felt to be most appropriate for this patient at this time.  All issues noted in this document were discussed and addressed.  No physical exam was performed (except for noted visual exam findings with Video Visits).  Please refer to the patient's chart (MyChart message for video visits and phone note for telephone visits) for the patient's consent to telehealth for [facility] ([facility]  Date:  09/16/2018   ID:  Kim Brock, DOB 04/01/1960, MRN [id number]  Patient Location:  Home  Provider location:   [place]  PCP:  Harlan Stains, MD  Sleep Medicine:  Fransico Him, MD Electrophysiologist:  None   Chief Complaint:  OSA  History of Present Illness:    Kim Brock is a 58 y.o. female who presents via audio/video conferencing for a telehealth visit today.    She is doing well with her CPAP device and thinks that she has gotten used to it.  She tolerates the mask and feels the pressure is adequate.  Since going on CPAP she feels rested in the am and has no significant daytime sleepiness.  She denies any significant mouth or nasal dryness or  nasal congestion.  She does not think that he snores.    The patient does not have symptoms concerning for COVID-19 infection (fever, chills, cough, or new shortness of breath).    Prior CV studies:   The following studies were reviewed today:  None  Past Medical History:  Diagnosis Date  . Allergic rhinitis   . Anxiety   . Arthritis    oa both knees  . Asthma   . Bronchospasm    With URI  . Depression   . Depression with anxiety   . Diabetes mellitus without complication (HCC)    diet controlled  . Hypertension   . Morbid obesity with BMI of 50.0-59.9, adult (HCC)    BMI 59.3  . Morbid obesity with BMI of 50.0-59.9, adult ([place])   . Near sighted    Wears contacts  . OSA on CPAP    on auto CPAP for life  . PONV (postoperative nausea and vomiting)    nausea likes zofran, pt has motion sickness  . Vitamin D deficiency    Past Surgical History:  Procedure Laterality Date  . CHOLECYSTECTOMY  1999  . DILATION AND CURETTAGE OF UTERUS  2011  . KNEE SURGERY  2009/2010   left and right  knees arthroscopic meniscal repair  . LAPAROSCOPIC GASTRIC BANDING N/A 03/30/2012   Procedure: LAPAROSCOPIC GASTRIC BANDING;  Surgeon: Gayland Curry, MD,FACS;  Location: [facility];  Service: General;  Laterality: N/A;  Laparoscopic Adjustable Gastric Band Placement,   . MESH APPLIED TO LAP PORT  N/A 03/30/2012   Procedure: MESH APPLIED TO LAP PORT;  Surgeon: Atilano InaEric M Wilson, MD,FACS;  Location: [facility];  Service: General;  Laterality: N/A;  . PARTIAL KNEE ARTHROPLASTY Left 07/14/2016   Procedure: UNICOMPARTMENTAL LEFT KNEE;  Surgeon: Durene Romanslin, Matthew, MD;  Location: [facility];  Service: Orthopedics;  Laterality: Left;  . TUBAL LIGATION  2001     Current Meds  Medication Sig  . albuterol (PROVENTIL HFA;VENTOLIN HFA) 108 (90 BASE) MCG/ACT inhaler Inhale 2 puffs into the lungs every 4 (four) hours as needed for shortness of breath.   Marland Kitchen. amLODipine (NORVASC) 10 MG tablet Take 10 mg by mouth daily.  . clonazePAM  (KLONOPIN) 0.5 MG tablet Take 0.25-0.5 mg by mouth at bedtime as needed for anxiety.   . diclofenac (VOLTAREN) 75 MG EC tablet Take 75 mg by mouth 2 (two) times daily.  Marland Kitchen. escitalopram (LEXAPRO) 10 MG tablet Take 30 mg by mouth daily.   Marland Kitchen. HYDROcodone-acetaminophen (NORCO) 10-325 MG tablet Take 0.5-1 tablets by mouth every 6 (six) hours as needed for moderate pain.  Marland Kitchen. lamoTRIgine (LAMICTAL) 100 MG tablet Take 100 mg by mouth daily.  Marland Kitchen. lisinopril-hydrochlorothiazide (PRINZIDE,ZESTORETIC) 20-25 MG tablet Take 1 tablet by mouth daily.  Marland Kitchen. loratadine (CLARITIN) 10 MG tablet Take 10 mg by mouth daily as needed for allergies.  Marland Kitchen. QVAR 80 MCG/ACT inhaler Inhale 2 puffs into the lungs 2 (two) times daily.      Allergies:   Chlorhexidine, Tape, Demerol [meperidine], Epinephrine, and Percocet [oxycodone-acetaminophen]   Social History   Tobacco Use  . Smoking status: Former Smoker    Packs/day: 1.00    Years: 11.00    Pack years: 11.00    Types: Cigarettes    Quit date: 08/06/1991    Years since quitting: 27.1  . Smokeless tobacco: Never Used  Substance Use Topics  . Alcohol use: Yes    Alcohol/week: 1.0 - 2.0 standard drinks    Types: 1 - 2 Glasses of wine per week    Comment: 3 drinks per week  . Drug use: No     Family Hx: The patient's family history includes Breast cancer (age of onset: 7274) in her mother; Cancer in her paternal grandmother; Diabetes in her mother; Hyperlipidemia in her father; Hypertension in her mother; Osteoarthritis in her mother.  ROS:   Please see the history of present illness.     All other systems reviewed and are negative.   Labs/Other Tests and Data Reviewed:    Recent Labs: No results found for requested labs within last 8760 hours.   Recent Lipid Panel Lab Results  Component Value Date/Time   CHOL 164 01/06/2012 09:25 AM   TRIG 157 (H) 01/06/2012 09:25 AM   HDL 62 01/06/2012 09:25 AM   CHOLHDL 2.6 01/06/2012 09:25 AM   LDLCALC 71 01/06/2012 09:25  AM    Wt Readings from Last 3 Encounters:  09/16/18 (!) 311 lb (141.1 kg)  07/27/17 291 lb 1.9 oz (132.1 kg)  07/14/16 (!) 307 lb (139.3 kg)     Objective:    Vital Signs:  BP 138/78   Pulse 82   Ht 5' 4.75" (1.645 m)   Wt (!) 311 lb (141.1 kg)   BMI 52.15 kg/m    CONSTITUTIONAL:  Well nourished, well developed female in no acute distress.  EYES: anicteric MOUTH: oral mucosa is pink RESPIRATORY: Normal respiratory effort, symmetric expansion CARDIOVASCULAR: No peripheral edema SKIN: No rash, lesions or ulcers MUSCULOSKELETAL: no digital cyanosis NEURO: Cranial Nerves  II-XII grossly intact, moves all extremities PSYCH: Intact judgement and insight.  A&O x 3, Mood/affect appropriate   ASSESSMENT & PLAN:    1.  OSA -  The patient is tolerating PAP therapy well without any problems.  The patient has been using and benefiting from PAP use and will continue to benefit from therapy. I will get a download from her DME.  2.  Hypertension - her BP is controlled on exam.  She will continue on Amlodipine 10mg  daily, Lisinopril-HCT 20-25mg  daily.    3.  Morbid Obesity - I have encouraged her to get into a routine exercise program and cut back on carbs and portions.   COVID-19 Education: The signs and symptoms of COVID-19 were discussed with the patient and how to seek care for testing (follow up with PCP or arrange E-visit).  The importance of social distancing was discussed today.  Patient Risk:   After full review of this patient's clinical status, I feel that they are at least moderate risk at this time.  Time:   Today, I have spent 20 minutes on telemedicine discussing medical problems including OSA, HTN, obesity.  We also reviewed the symptoms of COVID 19 and the ways to protect against contracting the virus with telehealth technology.    Medication Adjustments/Labs and Tests Ordered: Current medicines are reviewed at length with the patient today.  Concerns regarding medicines  are outlined above.  Tests Ordered: No orders of the defined types were placed in this encounter.  Medication Changes: No orders of the defined types were placed in this encounter.   Disposition:  Follow up in 1 year(s)  Signed, Armanda Magicraci Feather Berrie, MD  09/16/2018 8:56 AM     [facility]

## 2018-09-16 NOTE — Patient Instructions (Signed)
Your physician recommends that you continue on your current medications as directed. Please refer to the Current Medication list given to you today.   Your physician wants you to follow-up in: YEAR WITH DR TURNER You will receive a reminder letter in the mail two months in advance. If you don't receive a letter, please call our office to schedule the follow-up appointment.  

## 2019-04-28 ENCOUNTER — Telehealth: Payer: Self-pay | Admitting: *Deleted

## 2019-04-28 NOTE — Telephone Encounter (Signed)
   [facility] Pre-operative Risk Assessment    Request for surgical clearance:  1. What type of surgery is being performed? RIGHT MEDIAL UNICOMPARTMENTAL ARTHROPLASTY   2. When is this surgery scheduled? 05/26/19   3. What type of clearance is required (medical clearance vs. Pharmacy clearance to hold med vs. Both)? MEDICAL  4. Are there any medications that need to be held prior to surgery and how long? NONE LISTED   5. Practice name and name of physician performing surgery? [facility]; DR. Scotia   6. What is your office phone number [phone]     7.   What is your office fax number [phone] ATTN; SHERRY WILLS  8.   Anesthesia type (None, local, MAC, general) ? SPINAL   Julaine Hua 04/28/2019, 1:56 PM  _________________________________________________________________   (provider comments below)

## 2019-04-28 NOTE — Telephone Encounter (Signed)
Dr Mayford Knife, you follow this pt for OSA on CPAP. She has risk factors of obesity, HTN, diet controlled DM. No prior hx of CAD of any prior testing. Can we clear her if no symptoms?

## 2019-04-28 NOTE — Telephone Encounter (Signed)
She needs to be cleared from her PCP.  She is cleared from sleep apnea standpoint

## 2019-04-29 NOTE — Telephone Encounter (Signed)
   Primary Cardiologist: No primary care provider on file.  Chart reviewed as part of pre-operative protocol coverage. She is only seen in our office for sleep apnea, not general cardiology. She is cleared from a sleep apnea standpoint, but any other clearance needs to be done by her PCP.   I will route this recommendation to the requesting party via Epic fax function and remove from pre-op pool.  Please call with questions.  Berton Bon, NP 04/29/2019, 8:25 AM

## 2019-05-13 NOTE — H&P (Signed)
UNICOMPARTMENTAL KNEE ADMISSION H&P  Patient is being admitted for right medial unicompartmental knee arthroplasty.  Subjective:  Chief Complaint:  Right knee medial compartmental primary OA /pain    HPI: Kim Brock, 59 y.o. female , has a history of pain and functional disability in the right and has failed non-surgical conservative treatments for greater than 12 weeks to include NSAID's and/or analgesics, corticosteriod injections, viscosupplementation injections and activity modification.  Onset of symptoms was gradual, starting 5+ years ago with gradually worsening course since that time. The patient noted prior procedures on the knee to include  arthroscopy on the right knee(s).  Patient currently rates pain in the right knee(s) at 9 out of 10 with activity. Patient has night pain, worsening of pain with activity and weight bearing, pain that interferes with activities of daily living, pain with passive range of motion, crepitus and joint swelling.  Patient has evidence of periarticular osteophytes and joint space narrowing of the medial compartment by imaging studies.  There is no active infection.  Risks, benefits and expectations were discussed with the patient.  Risks including but not limited to the risk of anesthesia, blood clots, nerve damage, blood vessel damage, failure of the prosthesis, infection and up to and including death.  Patient understand the risks, benefits and expectations and wishes to proceed with surgery.   PCP: Laurann Montana, MD  D/C Plans:       Home (obs)  Post-op Meds:       No Rx given   Tranexamic Acid:      To be given - IV   Decadron:      Is to be given  FYI:      ASA  Norco  CPAP  Continue eye drops  DME:   Pt already has equipment   PT:   OPPT    Ph[facility]acy: [facility] - [address] [place], [place]   Past Medical History:  Diagnosis Date  . Allergic rhinitis   . Anxiety   . Arthritis    oa both knees  . Asthma   .  Bronchospasm    With URI  . Depression   . Depression with anxiety   . Diabetes mellitus without complication (HCC)    diet controlled  . Hypertension   . Morbid obesity with BMI of 50.0-59.9, adult (HCC)    BMI 59.3  . Morbid obesity with BMI of 50.0-59.9, adult (HCC)   . Near sighted    Wears contacts  . OSA on CPAP    on auto CPAP for life  . PONV (postoperative nausea and vomiting)    nausea likes zofran, pt has motion sickness  . Vitamin D deficiency      Past Surgical History:  Procedure Laterality Date  . CHOLECYSTECTOMY  1999  . DILATION AND CURETTAGE OF UTERUS  2011  . KNEE SURGERY  2009/2010   left and right  knees arthroscopic meniscal repair  . LAPAROSCOPIC GASTRIC BANDING N/A 03/30/2012   Procedure: LAPAROSCOPIC GASTRIC BANDING;  Surgeon: Atilano Ina, MD,FACS;  Location: [facility];  Service: General;  Laterality: N/A;  Laparoscopic Adjustable Gastric Band Placement,   . MESH APPLIED TO LAP PORT N/A 03/30/2012   Procedure: MESH APPLIED TO LAP PORT;  Surgeon: Atilano Ina, MD,FACS;  Location: [facility];  Service: General;  Laterality: N/A;  . PARTIAL KNEE ARTHROPLASTY Left 07/14/2016   Procedure: UNICOMPARTMENTAL LEFT KNEE;  Surgeon: Durene Romans, MD;  Location: [facility];  Service: Orthopedics;  Laterality: Left;  . TUBAL LIGATION  2001    Allergies  Allergen Reactions  . Chlorhexidine Dermatitis and Rash  . Tape Other (See Comments) and Rash    Steri strips - blisters  . Demerol [Meperidine] Other (See Comments)    Blood pressure drops and makes me violently nauseous.  Marland Kitchen Epinephrine Other (See Comments)    In Novocaine - makes jittery and heart race  . Percocet [Oxycodone-Acetaminophen]     Hallucinate, Family h/o of poor tolerance.  Will not take - can tolerate Vicodin    Social History   Tobacco Use  . Smoking status: Former Smoker    Packs/day: 1.00    Years: 11.00    Pack years: 11.00    Types: Cigarettes    Quit date: 08/06/1991    Years since  quitting: 27.7  . Smokeless tobacco: Never Used  Substance Use Topics  . Alcohol use: Yes    Alcohol/week: 1.0 - 2.0 standard drinks    Types: 1 - 2 Glasses of wine per week    Comment: 3 drinks per week  . Drug use: No    Family History  Problem Relation Age of Onset  . Diabetes Mother   . Hypertension Mother   . Osteoarthritis Mother   . Breast cancer Mother 68  . Hyperlipidemia Father   . Cancer Paternal Grandmother        Colon     Review of Systems  Constitutional: Negative.   HENT: Negative.   Eyes: Negative.   Respiratory: Negative.   Cardiovascular: Negative.   Gastrointestinal: Negative.   Genitourinary: Negative.   Musculoskeletal: Positive for joint pain.  Skin: Negative.   Neurological: Negative.   Endo/Heme/Allergies: Positive for environmental allergies.  Psychiatric/Behavioral: Positive for depression. The patient is nervous/anxious.      Objective:   Physical Exam  Constitutional: She is oriented to person, place, and time. She appears well-developed.  HENT:  Head: Normocephalic.  Eyes: Pupils are equal, round, and reactive to light.  Neck: No JVD present. No tracheal deviation present. No thyromegaly present.  Cardiovascular: Normal rate, regular rhythm and intact distal pulses.  Respiratory: Effort normal and breath sounds normal. No respiratory distress. She has no wheezes.  GI: Soft. There is no abdominal tenderness. There is no guarding.  Musculoskeletal:     Cervical back: Neck supple.     Right knee: Swelling and bony tenderness present. No deformity, erythema, ecchymosis or lacerations. Decreased range of motion. Tenderness present over the medial joint line. No lateral joint line tenderness.  Lymphadenopathy:    She has no cervical adenopathy.  Neurological: She is alert and oriented to person, place, and time.  Skin: Skin is warm and dry.  Psychiatric: She has a normal mood and affect.       Labs:  Estimated body mass index is  52.15 kg/m as calculated from the following:   Height as of 09/16/18: 5' 4.75" (1.645 m).   Weight as of 09/16/18: 141.1 kg.   Imaging Review Plain radiographs demonstrate severe degenerative joint disease of the right knee(s) medial compartment.  The bone quality appears to be good for age and reported activity level.  Assessment/Plan:  End stage arthritis, right knee medial compartment  The patient history, physical examination, clinical judgment of the provider and imaging studies are consistent with end stage degenerative joint disease of the right knee(s) and medial unicompartmental knee arthroplasty is deemed medically necessary. The treatment options including medical management, injection therapy arthroscopy and arthroplasty  were discussed at length. The risks and benefits of total knee arthroplasty were presented and reviewed. The risks due to aseptic loosening, infection, stiffness, patella tracking problems, thromboembolic complications and other imponderables were discussed. The patient acknowledged the explanation, agreed to proceed with the plan and consent was signed. Patient is being admitted for outpatient / observation treatment for surgery, pain control, PT, OT, prophylactic antibiotics, VTE prophylaxis, progressive ambulation and ADL's and discharge planning. The patient is planning to be discharged home.     West Pugh Brinly Maietta   PA-C  05/13/2019, 1:14 PM

## 2019-05-17 NOTE — Patient Instructions (Addendum)
DUE TO COVID-19 ONLY ONE VISITOR IS ALLOWED TO COME WITH YOU AND STAY IN THE WAITING ROOM ONLY DURING PRE OP AND PROCEDURE DAY OF SURGERY. TWO  VISITORs   MAY VISIT WITH YOU AFTER SURGERY IN YOUR PRIVATE ROOM DURING VISITING HOURS ONLY!  10a-8p  YOU NEED TO HAVE A COVID 19 TEST ON_4-19-21______ @__1245  pm_____, THIS TEST MUST BE DONE BEFORE SURGERY, COME  [address], [place]  , [address].  ([place]) ONCE YOUR COVID TEST IS COMPLETED, PLEASE BEGIN THE QUARANTINE INSTRUCTIONS AS OUTLINED IN YOUR HANDOUT.                Kim Brock  05/17/2019   Your procedure is scheduled on: 05-26-19   Report to [facility] Main  Entrance   Report to admitting at      0735 AM    Bring cpap mask and tubing ONLY     Call this number if you have problems the morning of surgery [phone]    Remember:  NO SOLID FOOD AFTER MIDNIGHT THE NIGHT PRIOR TO SURGERY. NOTHING BY MOUTH EXCEPT CLEAR LIQUIDS UNTIL  0700 am  . PLEASE FINISH g2  DRINK PER SURGEON ORDER  WHICH NEEDS TO BE COMPLETED AT       0700 am then nothing by mouth.    CLEAR LIQUID DIET   Foods       Allowed                                                                                             Foods Excluded  Coffee and tea, regular and decaf  No creamer                           liquids that you cannot  Plain Jell-O any favor except red or purple                                           see through such as: Fruit ices (not with fruit pulp)                                                              milk, soups, orange juice  Iced Popsicles                                                               All solid food Carbonated beverages, regular and diet  Cranberry, grape and apple juices Sports drinks like Gatorade Lightly seasoned clear broth or consume(fat free) Sugar, honey  syrup   _____________________________________________________________________   BRUSH YOUR TEETH MORNING OF SURGERY AND RINSE YOUR MOUTH OUT, NO CHEWING GUM CANDY OR MINTS.     Take these medicines the morning of surgery with A SIP OF WATER: inhalers and bring with you, lamicital, lexapro, eye drops, amlodipine                                 You may not have any metal on your body including hair pins and              piercings  Do not wear jewelry, make-up, lotions, powders or perfumes, deodorant             Do not wear nail polish on your fingernails.  Do not shave  48 hours prior to surgery.     Do not bring valuables to the hospital.  IS NOT             RESPONSIBLE   FOR VALUABLES.  Contacts, dentures or bridgework may not be worn into surgery.                 Please read over the following fact sheets you were given: _____________________________________________________________________           [facility] - Preparing for Surgery Before surgery, you can play an important role.  Because skin is not sterile, your skin needs to be as free of germs as possible.  You can reduce the number of germs on your skin by washing with CHG (chlorahexidine gluconate) soap before surgery.  CHG is an antiseptic cleaner which kills germs and bonds with the skin to continue killing germs even after washing. Please DO NOT use if you have an allergy to CHG or antibacterial soaps.  If your skin becomes reddened/irritated stop using the CHG and inform your nurse when you arrive at Short Stay. Do not shave (including legs and underarms) for at least 48 hours prior to the first CHG shower.  You may shave your face/neck. Please follow these instructions carefully:  1.  Shower with CHG Soap the night before surgery and the  morning of Surgery.  2.  If you choose to wash your hair, wash your hair first as usual with your  normal  shampoo.  3.  After you shampoo, rinse your hair and body  thoroughly to remove the  shampoo.                           4.  Use CHG as you would any other liquid soap.  You can apply chg directly  to the skin and wash                       Gently with a scrungie or clean washcloth.  5.  Apply the CHG Soap to your body ONLY FROM THE NECK DOWN.   Do not use on face/ open                           Wound or open sores. Avoid contact with eyes, ears mouth and genitals (private parts).  Wash face,  Genitals (private parts) with your normal soap.             6.  Wash thoroughly, paying special attention to the area where your surgery  will be performed.  7.  Thoroughly rinse your body with warm water from the neck down.  8.  DO NOT shower/wash with your normal soap after using and rinsing off  the CHG Soap.                9.  Pat yourself dry with a clean towel.            10.  Wear clean pajamas.            11.  Place clean sheets on your bed the night of your first shower and do not  sleep with pets. Day of Surgery : Do not apply any lotions/deodorants the morning of surgery.  Please wear clean clothes to the hospital/surgery center.  FAILURE TO FOLLOW THESE INSTRUCTIONS MAY RESULT IN THE CANCELLATION OF YOUR SURGERY PATIENT SIGNATURE_________________________________  NURSE SIGNATURE__________________________________  ________________________________________________________________________   Kim Brock  An incentive spirometer is a tool that can help keep your lungs clear and active. This tool measures how well you are filling your lungs with each breath. Taking long deep breaths may help reverse or decrease the chance of developing breathing (pulmonary) problems (especially infection) following:  A long period of time when you are unable to move or be active. BEFORE THE PROCEDURE   If the spirometer includes an indicator to show your best effort, your nurse or respiratory therapist will set it to a desired goal.  If  possible, sit up straight or lean slightly forward. Try not to slouch.  Hold the incentive spirometer in an upright position. INSTRUCTIONS FOR USE  1. Sit on the edge of your bed if possible, or sit up as far as you can in bed or on a chair. 2. Hold the incentive spirometer in an upright position. 3. Breathe out normally. 4. Place the mouthpiece in your mouth and seal your lips tightly around it. 5. Breathe in slowly and as deeply as possible, raising the piston or the ball toward the top of the column. 6. Hold your breath for 3-5 seconds or for as long as possible. Allow the piston or ball to fall to the bottom of the column. 7. Remove the mouthpiece from your mouth and breathe out normally. 8. Rest for a few seconds and repeat Steps 1 through 7 at least 10 times every 1-2 hours when you are awake. Take your time and take a few normal breaths between deep breaths. 9. The spirometer may include an indicator to show your best effort. Use the indicator as a goal to work toward during each repetition. 10. After each set of 10 deep breaths, practice coughing to be sure your lungs are clear. If you have an incision (the cut made at the time of surgery), support your incision when coughing by placing a pillow or rolled up towels firmly against it. Once you are able to get out of bed, walk around indoors and cough well. You may stop using the incentive spirometer when instructed by your caregiver.  RISKS AND COMPLICATIONS  Take your time so you do not get dizzy or light-headed.  If you are in pain, you may need to take or ask for pain medication before doing incentive spirometry. It is harder to take a deep breath if you are having pain.  AFTER USE  Rest and breathe slowly and easily.  It can be helpful to keep track of a log of your progress. Your caregiver can provide you with a simple table to help with this. If you are using the spirometer at home, follow these instructions: [place] [facility]  IF:   You are having difficultly using the spirometer.  You have trouble using the spirometer as often as instructed.  Your pain medication is not giving enough relief while using the spirometer.  You develop fever of 100.5 F (38.1 C) or higher. SEEK IMMEDIATE MEDICAL CARE IF:   You cough up bloody sputum that had not been present before.  You develop fever of 102 F (38.9 C) or greater.  You develop worsening pain at or near the incision site. MAKE SURE YOU:   Understand these instructions.  Will watch your condition.  Will get help right away if you are not doing well or get worse. Document Released: 06/02/2006 Document Revised: 04/14/2011 Document Reviewed: 08/03/2006 Exit[employer] Patient Information 2014 ExitCare, [place].   ________________________________________________________________________  WHAT IS A BLOOD TRANSFUSION? Blood Transfusion Information  A transfusion is the replacement of blood or some of its parts. Blood is made up of multiple cells which provide different functions.  Red blood cells carry oxygen and are used for blood loss replacement.  White blood cells fight against infection.  Platelets control bleeding.  Plasma helps clot blood.  Other blood products are available for specialized needs, such as hemophilia or other clotting disorders. BEFORE THE TRANSFUSION  Who gives blood for transfusions?   Healthy volunteers who are fully evaluated to make sure their blood is safe. This is blood bank blood. Transfusion therapy is the safest it has ever been in the practice of medicine. Before blood is taken from a donor, a complete history is taken to make sure that person has no history of diseases nor engages in risky social behavior (examples are intravenous drug use or sexual activity with multiple partners). The donor's travel history is screened to minimize risk of transmitting infections, such as malaria. The donated blood is tested for signs of  infectious diseases, such as HIV and hepatitis. The blood is then tested to be sure it is compatible with you in order to minimize the chance of a transfusion reaction. If you or a relative donates blood, this is often done in anticipation of surgery and is not appropriate for emergency situations. It takes many days to process the donated blood. RISKS AND COMPLICATIONS Although transfusion therapy is very safe and saves many lives, the main dangers of transfusion include:   Getting an infectious disease.  Developing a transfusion reaction. This is an allergic reaction to something in the blood you were given. Every precaution is taken to prevent this. The decision to have a blood transfusion has been considered carefully by your caregiver before blood is given. Blood is not given unless the benefits outweigh the risks. AFTER THE TRANSFUSION  Right after receiving a blood transfusion, you will usually feel much better and more energetic. This is especially true if your red blood cells have gotten low (anemic). The transfusion raises the level of the red blood cells which carry oxygen, and this usually causes an energy increase.  The nurse administering the transfusion will monitor you carefully for complications. HOME CARE INSTRUCTIONS  No special instructions are needed after a transfusion. You may find your energy is better. Speak with your caregiver about any limitations on activity for underlying diseases  you may have. SEEK MEDICAL CARE IF:   Your condition is not improving after your transfusion.  You develop redness or irritation at the intravenous (IV) site. SEEK IMMEDIATE MEDICAL CARE IF:  Any of the following symptoms occur over the next 12 hours:  Shaking chills.  You have a temperature by mouth above 102 F (38.9 C), not controlled by medicine.  Chest, back, or muscle pain.  People around you feel you are not acting correctly or are confused.  Shortness of breath or  difficulty breathing.  Dizziness and fainting.  You get a rash or develop hives.  You have a decrease in urine output.  Your urine turns a dark color or changes to pink, red, or brown. Any of the following symptoms occur over the next 10 days:  You have a temperature by mouth above 102 F (38.9 C), not controlled by medicine.  Shortness of breath.  Weakness after normal activity.  The white part of the eye turns yellow (jaundice).  You have a decrease in the amount of urine or are urinating less often.  Your urine turns a dark color or changes to pink, red, or brown. Document Released: 01/18/2000 Document Revised: 04/14/2011 Document Reviewed: 09/06/2007 [facility] Patient Information 2014 [place], [place].  _______________________________________________________________________

## 2019-05-17 NOTE — Progress Notes (Addendum)
PCP - Laurann Montana Clearance on chart Cardiologist - Berton Bon , NP sees for OSA only cleared 04-29-19 epic  Chest x-ray -  EKG -  Stress Test -  ECHO -  Cardiac Cath -   Sleep Study -  CPAP - YES   Fasting Blood Sugar -  Checks Blood Sugar _____ times a day  Blood Thinner Instructions: Aspirin Instructions: Last Dose:  Anesthesia review: OSA , Hgba1c 04-06-19 5.9 on chart , BMI 52.32 , abn ekg  Patient denies shortness of breath, fever, cough and chest pain at PAT appointment   NONE   Patient verbalized understanding of instructions that were given to them at the PAT appointment. Patient was also instructed that they will need to review over the PAT instructions again at home before surgery.

## 2019-05-18 ENCOUNTER — Encounter (HOSPITAL_COMMUNITY): Payer: Self-pay

## 2019-05-18 ENCOUNTER — Encounter (HOSPITAL_COMMUNITY)
Admission: RE | Admit: 2019-05-18 | Discharge: 2019-05-18 | Disposition: A | Discharge status: Home / Self Care | Payer: Commercial Managed Care - PPO | Source: Ambulatory Visit | Attending: Orthopedic Surgery | Admitting: Orthopedic Surgery

## 2019-05-18 ENCOUNTER — Other Ambulatory Visit: Payer: Self-pay

## 2019-05-18 DIAGNOSIS — R9431 Abnormal electrocardiogram [ECG] [EKG]: Secondary | ICD-10-CM | POA: Diagnosis not present

## 2019-05-18 DIAGNOSIS — Z01818 Encounter for other preprocedural examination: Secondary | ICD-10-CM | POA: Insufficient documentation

## 2019-05-18 HISTORY — DX: Unspecified glaucoma: H40.9

## 2019-05-18 LAB — BASIC METABOLIC PANEL
Anion gap: 9 (ref 5–15)
BUN: 14 mg/dL (ref 6–20)
CO2: 28 mmol/L (ref 22–32)
Calcium: 9.2 mg/dL (ref 8.9–10.3)
Chloride: 101 mmol/L (ref 98–111)
Creatinine, Ser: 0.65 mg/dL (ref 0.44–1.00)
GFR calc Af Amer: >60 mL/min (ref >60)
GFR calc non Af Amer: >60 mL/min (ref >60)
Glucose, Bld: 125 mg/dL — ABNORMAL HIGH (ref 70–99)
Potassium: 4 mmol/L (ref 3.5–5.1)
Sodium: 138 mmol/L (ref 135–145)

## 2019-05-18 LAB — SURGICAL PCR SCREEN
MRSA, PCR: NEGATIVE
Staphylococcus aureus: NEGATIVE

## 2019-05-18 LAB — CBC
HCT: 41.1 % (ref 36.0–46.0)
Hemoglobin: 13.7 g/dL (ref 12.0–15.0)
MCH: 30.7 pg (ref 26.0–34.0)
MCHC: 33.3 g/dL (ref 30.0–36.0)
MCV: 92.2 fL (ref 80.0–100.0)
Platelets: 292 10*3/uL (ref 150–400)
RBC: 4.46 MIL/uL (ref 3.87–5.11)
RDW: 12.8 % (ref 11.5–15.5)
WBC: 6.5 10*3/uL (ref 4.0–10.5)
nRBC: 0 % (ref 0.0–0.2)

## 2019-05-18 LAB — GLUCOSE, CAPILLARY: Glucose-Capillary: 130 mg/dL — ABNORMAL HIGH (ref 70–99)

## 2019-05-23 ENCOUNTER — Other Ambulatory Visit (HOSPITAL_COMMUNITY)
Admission: RE | Admit: 2019-05-23 | Discharge: 2019-05-23 | Disposition: A | Discharge status: Home / Self Care | Payer: Commercial Managed Care - PPO | Source: Ambulatory Visit | Attending: Orthopedic Surgery | Admitting: Orthopedic Surgery

## 2019-05-23 DIAGNOSIS — Z20822 Contact with and (suspected) exposure to covid-19: Secondary | ICD-10-CM | POA: Insufficient documentation

## 2019-05-23 DIAGNOSIS — Z01812 Encounter for preprocedural laboratory examination: Secondary | ICD-10-CM | POA: Insufficient documentation

## 2019-05-23 LAB — SARS CORONAVIRUS 2 (TAT 6-24 HRS): SARS Coronavirus 2: NEGATIVE

## 2019-05-25 MED ORDER — DEXTROSE 5 % IV SOLN
3.0000 g | INTRAVENOUS | Status: AC
  Administered 2019-05-26: 3 g via INTRAVENOUS
  Filled 2019-05-25: qty 3

## 2019-05-26 ENCOUNTER — Encounter (HOSPITAL_COMMUNITY)
Admission: RE | Disposition: A | Discharge status: Home / Self Care | Payer: Self-pay | Source: Ambulatory Visit | Attending: Orthopedic Surgery

## 2019-05-26 ENCOUNTER — Ambulatory Visit (HOSPITAL_COMMUNITY): Payer: Commercial Managed Care - PPO | Admitting: Physician Assistant

## 2019-05-26 ENCOUNTER — Ambulatory Visit (HOSPITAL_COMMUNITY): Payer: Commercial Managed Care - PPO | Admitting: Certified Registered Nurse Anesthetist

## 2019-05-26 ENCOUNTER — Encounter (HOSPITAL_COMMUNITY): Payer: Self-pay | Admitting: Orthopedic Surgery

## 2019-05-26 ENCOUNTER — Observation Stay (HOSPITAL_COMMUNITY)
Admission: RE | Admit: 2019-05-26 | Discharge: 2019-05-27 | Disposition: A | Discharge status: Home / Self Care | Payer: Commercial Managed Care - PPO | Source: Ambulatory Visit | Attending: Orthopedic Surgery | Admitting: Orthopedic Surgery

## 2019-05-26 ENCOUNTER — Other Ambulatory Visit: Payer: Self-pay

## 2019-05-26 DIAGNOSIS — Z87891 Personal history of nicotine dependence: Secondary | ICD-10-CM | POA: Insufficient documentation

## 2019-05-26 DIAGNOSIS — Z8262 Family history of osteoporosis: Secondary | ICD-10-CM | POA: Insufficient documentation

## 2019-05-26 DIAGNOSIS — H409 Unspecified glaucoma: Secondary | ICD-10-CM | POA: Insufficient documentation

## 2019-05-26 DIAGNOSIS — Z6841 Body Mass Index (BMI) 40.0 and over, adult: Secondary | ICD-10-CM | POA: Insufficient documentation

## 2019-05-26 DIAGNOSIS — Z9884 Bariatric surgery status: Secondary | ICD-10-CM | POA: Insufficient documentation

## 2019-05-26 DIAGNOSIS — J45909 Unspecified asthma, uncomplicated: Secondary | ICD-10-CM | POA: Diagnosis not present

## 2019-05-26 DIAGNOSIS — G4733 Obstructive sleep apnea (adult) (pediatric): Secondary | ICD-10-CM | POA: Insufficient documentation

## 2019-05-26 DIAGNOSIS — I10 Essential (primary) hypertension: Secondary | ICD-10-CM | POA: Diagnosis not present

## 2019-05-26 DIAGNOSIS — M6281 Muscle weakness (generalized): Secondary | ICD-10-CM | POA: Insufficient documentation

## 2019-05-26 DIAGNOSIS — M1711 Unilateral primary osteoarthritis, right knee: Secondary | ICD-10-CM | POA: Diagnosis not present

## 2019-05-26 DIAGNOSIS — Z885 Allergy status to narcotic agent status: Secondary | ICD-10-CM | POA: Diagnosis not present

## 2019-05-26 DIAGNOSIS — E119 Type 2 diabetes mellitus without complications: Secondary | ICD-10-CM | POA: Diagnosis not present

## 2019-05-26 DIAGNOSIS — Z96651 Presence of right artificial knee joint: Secondary | ICD-10-CM

## 2019-05-26 HISTORY — PX: PARTIAL KNEE ARTHROPLASTY: SHX2174

## 2019-05-26 LAB — TYPE AND SCREEN
ABO/RH(D): O POS
Antibody Screen: NEGATIVE

## 2019-05-26 LAB — GLUCOSE, CAPILLARY
Glucose-Capillary: 128 mg/dL — ABNORMAL HIGH (ref 70–99)
Glucose-Capillary: 125 mg/dL — ABNORMAL HIGH (ref 70–99)
Glucose-Capillary: 161 mg/dL — ABNORMAL HIGH (ref 70–99)
Glucose-Capillary: 190 mg/dL — ABNORMAL HIGH (ref 70–99)

## 2019-05-26 SURGERY — ARTHROPLASTY, KNEE, UNICOMPARTMENTAL
Anesthesia: Monitor Anesthesia Care | Site: Knee | Laterality: Right

## 2019-05-26 MED ORDER — MORPHINE SULFATE (PF) 4 MG/ML IV SOLN: 0.5000 mg | INTRAVENOUS | Status: DC | PRN

## 2019-05-26 MED ORDER — DEXAMETHASONE SODIUM PHOSPHATE 10 MG/ML IJ SOLN
10.0000 mg | Freq: Once | INTRAMUSCULAR | Status: AC
  Administered 2019-05-27: 10 mg via INTRAVENOUS
  Filled 2019-05-26: qty 1

## 2019-05-26 MED ORDER — ALBUTEROL SULFATE (2.5 MG/3ML) 0.083% IN NEBU: 3.0000 mL | INHALATION_SOLUTION | RESPIRATORY_TRACT | Status: DC | PRN

## 2019-05-26 MED ORDER — SODIUM CHLORIDE (PF) 0.9 % IJ SOLN
INTRAMUSCULAR | Status: DC | PRN
  Administered 2019-05-26: 30 mL

## 2019-05-26 MED ORDER — ALUM & MAG HYDROXIDE-SIMETH 200-200-20 MG/5ML PO SUSP: 15.0000 mL | ORAL | Status: DC | PRN

## 2019-05-26 MED ORDER — LAMOTRIGINE 100 MG PO TABS
100.0000 mg | ORAL_TABLET | Freq: Every day | ORAL | Status: DC
  Administered 2019-05-27: 100 mg via ORAL
  Filled 2019-05-26: qty 1

## 2019-05-26 MED ORDER — TRANEXAMIC ACID-NACL 1000-0.7 MG/100ML-% IV SOLN
1000.0000 mg | INTRAVENOUS | Status: AC
  Administered 2019-05-26: 1000 mg via INTRAVENOUS
  Filled 2019-05-26: qty 100

## 2019-05-26 MED ORDER — ASPIRIN 81 MG PO CHEW
81.0000 mg | CHEWABLE_TABLET | Freq: Two times a day (BID) | ORAL | Status: DC
  Administered 2019-05-26 – 2019-05-27 (×2): 81 mg via ORAL
  Filled 2019-05-26 (×2): qty 1

## 2019-05-26 MED ORDER — BUDESONIDE 0.25 MG/2ML IN SUSP
0.2500 mg | Freq: Two times a day (BID) | RESPIRATORY_TRACT | Status: DC
  Filled 2019-05-26: qty 2

## 2019-05-26 MED ORDER — METOCLOPRAMIDE HCL 5 MG PO TABS: 5.0000 mg | ORAL_TABLET | Freq: Three times a day (TID) | ORAL | Status: DC | PRN

## 2019-05-26 MED ORDER — LATANOPROST 0.005 % OP SOLN
1.0000 [drp] | Freq: Every day | OPHTHALMIC | Status: DC
  Filled 2019-05-26: qty 2.5

## 2019-05-26 MED ORDER — BRINZOLAMIDE-BRIMONIDINE 1-0.2 % OP SUSP
1.0000 [drp] | Freq: Two times a day (BID) | OPHTHALMIC | Status: DC
  Administered 2019-05-27: 1 [drp] via OPHTHALMIC

## 2019-05-26 MED ORDER — DOCUSATE SODIUM 100 MG PO CAPS
100.0000 mg | ORAL_CAPSULE | Freq: Two times a day (BID) | ORAL | Status: DC
  Administered 2019-05-26 – 2019-05-27 (×2): 100 mg via ORAL
  Filled 2019-05-26 (×2): qty 1

## 2019-05-26 MED ORDER — KETOROLAC TROMETHAMINE 30 MG/ML IJ SOLN
INTRAMUSCULAR | Status: AC
  Filled 2019-05-26: qty 1

## 2019-05-26 MED ORDER — LORATADINE 10 MG PO TABS: 10.0000 mg | ORAL_TABLET | Freq: Every day | ORAL | Status: DC | PRN

## 2019-05-26 MED ORDER — BUPIVACAINE HCL (PF) 0.25 % IJ SOLN
INTRAMUSCULAR | Status: DC | PRN
  Administered 2019-05-26: 30 mL

## 2019-05-26 MED ORDER — POLYETHYLENE GLYCOL 3350 17 G PO PACK
17.0000 g | PACK | Freq: Two times a day (BID) | ORAL | Status: DC
  Administered 2019-05-26 – 2019-05-27 (×2): 17 g via ORAL
  Filled 2019-05-26 (×2): qty 1

## 2019-05-26 MED ORDER — METHOCARBAMOL 500 MG IVPB - SIMPLE MED
500.0000 mg | Freq: Four times a day (QID) | INTRAVENOUS | Status: DC | PRN
  Filled 2019-05-26: qty 50

## 2019-05-26 MED ORDER — DEXAMETHASONE SODIUM PHOSPHATE 10 MG/ML IJ SOLN
10.0000 mg | Freq: Once | INTRAMUSCULAR | Status: AC
  Administered 2019-05-26: 8 mg via INTRAVENOUS

## 2019-05-26 MED ORDER — ACETAMINOPHEN 10 MG/ML IV SOLN: 1000.0000 mg | Freq: Once | INTRAVENOUS | Status: DC | PRN

## 2019-05-26 MED ORDER — ONDANSETRON HCL 4 MG/2ML IJ SOLN
INTRAMUSCULAR | Status: DC | PRN
  Administered 2019-05-26: 4 mg via INTRAVENOUS

## 2019-05-26 MED ORDER — PROPOFOL 500 MG/50ML IV EMUL
INTRAVENOUS | Status: DC | PRN
  Administered 2019-05-26: 75 ug/kg/min via INTRAVENOUS

## 2019-05-26 MED ORDER — POVIDONE-IODINE 10 % EX SWAB
2.0000 "application " | Freq: Once | CUTANEOUS | Status: AC
  Administered 2019-05-26: 2 via TOPICAL

## 2019-05-26 MED ORDER — KETOROLAC TROMETHAMINE 30 MG/ML IJ SOLN
INTRAMUSCULAR | Status: DC | PRN
  Administered 2019-05-26: 30 mg

## 2019-05-26 MED ORDER — ESCITALOPRAM OXALATE 20 MG PO TABS
30.0000 mg | ORAL_TABLET | Freq: Every day | ORAL | Status: DC
  Administered 2019-05-27: 30 mg via ORAL
  Filled 2019-05-26: qty 1

## 2019-05-26 MED ORDER — DEXAMETHASONE SODIUM PHOSPHATE 10 MG/ML IJ SOLN
INTRAMUSCULAR | Status: AC
  Filled 2019-05-26: qty 1

## 2019-05-26 MED ORDER — SODIUM CHLORIDE (PF) 0.9 % IJ SOLN
INTRAMUSCULAR | Status: AC
  Filled 2019-05-26: qty 50

## 2019-05-26 MED ORDER — FERROUS SULFATE 325 (65 FE) MG PO TABS
325.0000 mg | ORAL_TABLET | Freq: Two times a day (BID) | ORAL | Status: DC
  Administered 2019-05-27: 325 mg via ORAL
  Filled 2019-05-26: qty 1

## 2019-05-26 MED ORDER — HYDROCODONE-ACETAMINOPHEN 5-325 MG PO TABS
1.0000 | ORAL_TABLET | ORAL | Status: DC | PRN
  Administered 2019-05-26: 1 via ORAL
  Administered 2019-05-26: 2 via ORAL
  Administered 2019-05-27 (×3): 1 via ORAL
  Filled 2019-05-26 (×3): qty 1
  Filled 2019-05-26: qty 2
  Filled 2019-05-26: qty 1

## 2019-05-26 MED ORDER — ACETAMINOPHEN 325 MG PO TABS: 325.0000 mg | ORAL_TABLET | Freq: Four times a day (QID) | ORAL | Status: DC | PRN

## 2019-05-26 MED ORDER — FENTANYL CITRATE (PF) 100 MCG/2ML IJ SOLN: 25.0000 ug | INTRAMUSCULAR | Status: DC | PRN

## 2019-05-26 MED ORDER — BISACODYL 10 MG RE SUPP: 10.0000 mg | Freq: Every day | RECTAL | Status: DC | PRN

## 2019-05-26 MED ORDER — DEXMEDETOMIDINE HCL IN NACL 400 MCG/100ML IV SOLN
INTRAVENOUS | Status: DC | PRN
  Administered 2019-05-26: 8 ug via INTRAVENOUS
  Administered 2019-05-26: 4 ug via INTRAVENOUS
  Administered 2019-05-26: 8 ug via INTRAVENOUS

## 2019-05-26 MED ORDER — ROPIVACAINE HCL 7.5 MG/ML IJ SOLN
INTRAMUSCULAR | Status: DC | PRN
  Administered 2019-05-26: 20 mL via PERINEURAL

## 2019-05-26 MED ORDER — PROPOFOL 10 MG/ML IV BOLUS
INTRAVENOUS | Status: DC | PRN
  Administered 2019-05-26 (×3): 20 mg via INTRAVENOUS

## 2019-05-26 MED ORDER — ONDANSETRON HCL 4 MG/2ML IJ SOLN: 4.0000 mg | Freq: Four times a day (QID) | INTRAMUSCULAR | Status: DC | PRN

## 2019-05-26 MED ORDER — HYDROCODONE-ACETAMINOPHEN 7.5-325 MG PO TABS: 1.0000 | ORAL_TABLET | Freq: Once | ORAL | Status: DC | PRN

## 2019-05-26 MED ORDER — ONDANSETRON HCL 4 MG PO TABS: 4.0000 mg | ORAL_TABLET | Freq: Four times a day (QID) | ORAL | Status: DC | PRN

## 2019-05-26 MED ORDER — MAGNESIUM CITRATE PO SOLN: 1.0000 | Freq: Once | ORAL | Status: DC | PRN

## 2019-05-26 MED ORDER — LISINOPRIL 20 MG PO TABS: 20.0000 mg | ORAL_TABLET | Freq: Every day | ORAL | Status: DC

## 2019-05-26 MED ORDER — LACTATED RINGERS IV SOLN: INTRAVENOUS | Status: DC

## 2019-05-26 MED ORDER — MENTHOL 3 MG MT LOZG: 1.0000 | LOZENGE | OROMUCOSAL | Status: DC | PRN

## 2019-05-26 MED ORDER — FENTANYL CITRATE (PF) 100 MCG/2ML IJ SOLN
50.0000 ug | INTRAMUSCULAR | Status: DC
  Administered 2019-05-26: 100 ug via INTRAVENOUS
  Filled 2019-05-26: qty 2

## 2019-05-26 MED ORDER — METOCLOPRAMIDE HCL 5 MG/ML IJ SOLN: 5.0000 mg | Freq: Three times a day (TID) | INTRAMUSCULAR | Status: DC | PRN

## 2019-05-26 MED ORDER — CLONAZEPAM 0.25 MG PO TBDP
0.2500 mg | ORAL_TABLET | Freq: Every evening | ORAL | Status: DC | PRN
  Filled 2019-05-26: qty 2

## 2019-05-26 MED ORDER — SODIUM CHLORIDE 0.9 % IV SOLN: INTRAVENOUS | Status: DC

## 2019-05-26 MED ORDER — TRANEXAMIC ACID-NACL 1000-0.7 MG/100ML-% IV SOLN
1000.0000 mg | Freq: Once | INTRAVENOUS | Status: AC
  Administered 2019-05-26: 1000 mg via INTRAVENOUS
  Filled 2019-05-26: qty 100

## 2019-05-26 MED ORDER — ATORVASTATIN CALCIUM 10 MG PO TABS
10.0000 mg | ORAL_TABLET | Freq: Every day | ORAL | Status: DC
  Administered 2019-05-27: 10 mg via ORAL
  Filled 2019-05-26: qty 1

## 2019-05-26 MED ORDER — LISINOPRIL-HYDROCHLOROTHIAZIDE 20-25 MG PO TABS: 1.0000 | ORAL_TABLET | Freq: Every day | ORAL | Status: DC

## 2019-05-26 MED ORDER — EPHEDRINE 5 MG/ML INJ
INTRAVENOUS | Status: AC
  Filled 2019-05-26: qty 10

## 2019-05-26 MED ORDER — CELECOXIB 200 MG PO CAPS
200.0000 mg | ORAL_CAPSULE | Freq: Two times a day (BID) | ORAL | Status: DC
  Administered 2019-05-26 – 2019-05-27 (×2): 200 mg via ORAL
  Filled 2019-05-26 (×2): qty 1

## 2019-05-26 MED ORDER — AMLODIPINE BESYLATE 10 MG PO TABS: 10.0000 mg | ORAL_TABLET | Freq: Every day | ORAL | Status: DC

## 2019-05-26 MED ORDER — HYDROCHLOROTHIAZIDE 25 MG PO TABS: 25.0000 mg | ORAL_TABLET | Freq: Every day | ORAL | Status: DC

## 2019-05-26 MED ORDER — ONDANSETRON HCL 4 MG/2ML IJ SOLN
INTRAMUSCULAR | Status: AC
  Filled 2019-05-26: qty 2

## 2019-05-26 MED ORDER — BUPIVACAINE HCL 0.25 % IJ SOLN
INTRAMUSCULAR | Status: AC
  Filled 2019-05-26: qty 1

## 2019-05-26 MED ORDER — METHOCARBAMOL 500 MG PO TABS
500.0000 mg | ORAL_TABLET | Freq: Four times a day (QID) | ORAL | Status: DC | PRN
  Administered 2019-05-27: 500 mg via ORAL
  Filled 2019-05-26: qty 1

## 2019-05-26 MED ORDER — DIPHENHYDRAMINE HCL 12.5 MG/5ML PO ELIX: 12.5000 mg | ORAL_SOLUTION | ORAL | Status: DC | PRN

## 2019-05-26 MED ORDER — HYDROCODONE-ACETAMINOPHEN 7.5-325 MG PO TABS
1.0000 | ORAL_TABLET | ORAL | Status: DC | PRN
  Administered 2019-05-27: 1 via ORAL
  Filled 2019-05-26: qty 1

## 2019-05-26 MED ORDER — CEFAZOLIN SODIUM-DEXTROSE 2-4 GM/100ML-% IV SOLN
2.0000 g | Freq: Four times a day (QID) | INTRAVENOUS | Status: AC
  Administered 2019-05-26 (×2): 2 g via INTRAVENOUS
  Filled 2019-05-26 (×2): qty 100

## 2019-05-26 MED ORDER — EPHEDRINE SULFATE-NACL 50-0.9 MG/10ML-% IV SOSY
PREFILLED_SYRINGE | INTRAVENOUS | Status: DC | PRN
  Administered 2019-05-26 (×3): 10 mg via INTRAVENOUS

## 2019-05-26 MED ORDER — STERILE WATER FOR IRRIGATION IR SOLN
Status: DC | PRN
  Administered 2019-05-26: 2000 mL

## 2019-05-26 MED ORDER — MIDAZOLAM HCL 2 MG/2ML IJ SOLN
1.0000 mg | INTRAMUSCULAR | Status: DC
  Administered 2019-05-26: 2 mg via INTRAVENOUS
  Filled 2019-05-26: qty 2

## 2019-05-26 MED ORDER — PHENOL 1.4 % MT LIQD: 1.0000 | OROMUCOSAL | Status: DC | PRN

## 2019-05-26 MED ORDER — 0.9 % SODIUM CHLORIDE (POUR BTL) OPTIME
TOPICAL | Status: DC | PRN
  Administered 2019-05-26: 1000 mL

## 2019-05-26 SURGICAL SUPPLY — 52 items
ADH SKN CLS APL DERMABOND .7 (GAUZE/BANDAGES/DRESSINGS) ×1
BAG SPEC THK2 15X12 ZIP CLS (MISCELLANEOUS)
BAG ZIPLOCK 12X15 (MISCELLANEOUS) IMPLANT
BEARING MENISCAL TIBIAL 5 SM R (Orthopedic Implant) ×1 IMPLANT
BLADE SAW RECIPROCATING 77.5 (BLADE) ×2 IMPLANT
BLADE SAW SGTL 13.0X1.19X90.0M (BLADE) ×2 IMPLANT
BNDG ELASTIC 6X5.8 VLCR STR LF (GAUZE/BANDAGES/DRESSINGS) ×2 IMPLANT
BOWL SMART MIX CTS (DISPOSABLE) ×2 IMPLANT
BRNG TIB SM 5 PHS 3 RT MEN (Orthopedic Implant) ×1 IMPLANT
CEMENT BONE R 1X40 (Cement) ×3 IMPLANT
COMPONENT TIBIAL OXFRD MEDL RT (Orthopedic Implant) IMPLANT
COVER SURGICAL LIGHT HANDLE (MISCELLANEOUS) ×2 IMPLANT
COVER WAND RF STERILE (DRAPES) IMPLANT
CUFF TOURN SGL QUICK 34 (TOURNIQUET CUFF) ×2
CUFF TRNQT CYL 34X4.125X (TOURNIQUET CUFF) ×1 IMPLANT
DECANTER SPIKE VIAL GLASS SM (MISCELLANEOUS) ×4 IMPLANT
DERMABOND ADVANCED (GAUZE/BANDAGES/DRESSINGS) ×1
DERMABOND ADVANCED .7 DNX12 (GAUZE/BANDAGES/DRESSINGS) ×1 IMPLANT
DRAPE U-SHAPE 47X51 STRL (DRAPES) ×2 IMPLANT
DRESSING AQUACEL AG SP 3.5X10 (GAUZE/BANDAGES/DRESSINGS) ×1 IMPLANT
DRSG AQUACEL AG ADV 3.5X10 (GAUZE/BANDAGES/DRESSINGS) ×1 IMPLANT
DRSG AQUACEL AG SP 3.5X10 (GAUZE/BANDAGES/DRESSINGS) ×2
DURAPREP 26ML APPLICATOR (WOUND CARE) ×2 IMPLANT
ELECT REM PT RETURN 15FT ADLT (MISCELLANEOUS) ×2 IMPLANT
GLOVE BIOGEL PI IND STRL 7.5 (GLOVE) ×1 IMPLANT
GLOVE BIOGEL PI IND STRL 8.5 (GLOVE) ×1 IMPLANT
GLOVE BIOGEL PI INDICATOR 7.5 (GLOVE) ×1
GLOVE BIOGEL PI INDICATOR 8.5 (GLOVE) ×1
GLOVE ECLIPSE 8.0 STRL XLNG CF (GLOVE) ×2 IMPLANT
GLOVE ORTHO TXT STRL SZ7.5 (GLOVE) ×4 IMPLANT
GOWN STRL REUS W/TWL 2XL LVL3 (GOWN DISPOSABLE) ×2 IMPLANT
GOWN STRL REUS W/TWL LRG LVL3 (GOWN DISPOSABLE) ×2 IMPLANT
HOLDER FOLEY CATH W/STRAP (MISCELLANEOUS) IMPLANT
KIT TURNOVER KIT A (KITS) IMPLANT
LEGGING LITHOTOMY PAIR STRL (DRAPES) ×2 IMPLANT
MANIFOLD NEPTUNE II (INSTRUMENTS) ×2 IMPLANT
NDL SAFETY ECLIPSE 18X1.5 (NEEDLE) ×1 IMPLANT
NEEDLE HYPO 18GX1.5 SHARP (NEEDLE) ×2
PACK ICE MAXI GEL EZY WRAP (MISCELLANEOUS) ×2 IMPLANT
PACK TOTAL KNEE CUSTOM (KITS) ×2 IMPLANT
PEG FEMORAL PEGGED STRL SM (Knees) ×1 IMPLANT
PENCIL SMOKE EVACUATOR (MISCELLANEOUS) IMPLANT
SUT MNCRL AB 4-0 PS2 18 (SUTURE) ×2 IMPLANT
SUT STRATAFIX PDS+ 0 24IN (SUTURE) ×2 IMPLANT
SUT VIC AB 1 CT1 36 (SUTURE) ×2 IMPLANT
SUT VIC AB 2-0 CT1 27 (SUTURE) ×4
SUT VIC AB 2-0 CT1 TAPERPNT 27 (SUTURE) ×2 IMPLANT
SYR 3ML LL SCALE MARK (SYRINGE) ×2 IMPLANT
TIBIAL OXFORD MEDIAL RT (Orthopedic Implant) ×2 IMPLANT
TRAY FOLEY MTR SLVR 16FR STAT (SET/KITS/TRAYS/PACK) ×2 IMPLANT
WRAP KNEE MAXI GEL POST OP (GAUZE/BANDAGES/DRESSINGS) ×1 IMPLANT
YANKAUER SUCT BULB TIP 10FT TU (MISCELLANEOUS) ×2 IMPLANT

## 2019-05-26 NOTE — Interval H&P Note (Signed)
History and Physical Interval Note:  05/26/2019 8:37 AM  Kim Brock  has presented today for surgery, with the diagnosis of Right knee osteoarthritis medially.  The various methods of treatment have been discussed with the patient and family. After consideration of risks, benefits and other options for treatment, the patient has consented to  Procedure(s) with comments: UNICOMPARTMENTAL KNEE MEDIALLY (Right) - 70 mins as a surgical intervention.  The patient's history has been reviewed, patient examined, no change in status, stable for surgery.  I have reviewed the patient's chart and labs.  Questions were answered to the patient's satisfaction.     [name]

## 2019-05-26 NOTE — Anesthesia Procedure Notes (Signed)
Anesthesia Regional Block: Adductor canal block   Pre-Anesthetic Checklist: ,, timeout performed, Correct Patient, Correct Site, Correct Laterality, Correct Procedure, Correct Position, site marked, Risks and benefits discussed,  Surgical consent,  Pre-op evaluation,  At surgeon's request and post-op pain management  Laterality: Right and Lower  Prep: chloraprep       Needles:  Injection technique: Single-shot     Needle Length: 9cm  Needle Gauge: 22     Additional Needles: Arrow StimuQuik ECHO Echogenic Stimulating PNB Needle  Procedures:,,,, ultrasound used (permanent image in chart),,,,  Narrative:  Start time: 05/26/2019 8:40 AM End time: 05/26/2019 8:46 AM Injection made incrementally with aspirations every 5 mL.  Performed by: Personally  Anesthesiologist: Val Eagle, MD

## 2019-05-26 NOTE — Evaluation (Signed)
Physical Therapy Evaluation Patient Details Name: Kim Brock MRN: [id number] DOB: 08/26/1960 Today's Date: 05/26/2019   History of Present Illness  Patient is 59 y.o. female s/p Rt UKA on 05/26/19 with PMH significant for OSA, Obesisty, HTN, glaucoma, depression, DM, anxiety, OA, Lt UKA in 2018, laproscopic gastric band in 2014.  Clinical Impression   Kim Brock is a 59 y.o. female POD 0 s/p Rt UKA. Patient reports independence with mobility at baseline. Patient is now limited by functional impairments (see PT problem list below) and requires min assist for transfers and gait with RW. Patient was able to ambulate ~100 feet with RW and min guard/assist. Patient instructed in exercise to facilitate ROM and circulation. Patient will benefit from continued skilled PT interventions to address impairments and progress towards PLOF. Acute PT will follow to progress mobility and stair training in preparation for safe discharge home.     Follow Up Recommendations Follow surgeon's recommendation for DC plan and follow-up therapies    Equipment Recommendations  None recommended by PT(pt has DME)    Recommendations for Other Services       Precautions / Restrictions Precautions Precautions: Fall Restrictions Weight Bearing Restrictions: No      Mobility  Bed Mobility Overal bed mobility: Needs Assistance Bed Mobility: Supine to Sit     Supine to sit: Min guard;HOB elevated     General bed mobility comments: cues to use bed rail, pt able to bring LE's off EOB and raise trunk without assist.  Transfers Overall transfer level: Needs assistance Equipment used: Rolling walker (2 wheeled) Transfers: Sit to/from Stand Sit to Stand: Min assist;Min guard         General transfer comment: cues for technique with hand placement, light assist/guard for safety.  Ambulation/Gait Ambulation/Gait assistance: Min assist;Min guard Gait Distance (Feet): 100 Feet Assistive device:  Rolling walker (2 wheeled) Gait Pattern/deviations: Step-to pattern;Decreased stride length;Wide base of support Gait velocity: decreased   General Gait Details: cues for safe step pattern and proximity to RW at start. pt maintained safe use throughout with exception of turning to return to room. Pt unsafe with picking up walker and quickly spinning around. pt easily corrected for safety. pt paused 2x during gait due to back pain.  Stairs            Wheelchair Mobility    Modified Rankin (Stroke Patients Only)       Balance Overall balance assessment: Needs assistance Sitting-balance support: Feet supported Sitting balance-Leahy Scale: Good     Standing balance support: During functional activity;Bilateral upper extremity supported Standing balance-Leahy Scale: Fair          Pertinent Vitals/Pain Pain Assessment: 0-10 Pain Score: 2  Pain Location: Rt knee Pain Descriptors / Indicators: Aching Pain Intervention(s): Limited activity within patient's tolerance;Monitored during session;Repositioned;RN gave pain meds during session    [place] expects to be discharged to:: Private residence Living Arrangements: Alone Available Help at Discharge: Family Type of Home: House Home Access: Stairs to enter Entrance Stairs-Rails: None Entrance Stairs-Number of Steps: 3+1 Home Layout: One level Home Equipment: Environmental consultant - 4 wheels;Bedside commode;Shower seat Additional Comments: pt's daughter will stay until at least until Monday    Prior Function Level of Independence: Independent         Comments: works full time as Therapist, sports at dialysis center     [employer]   Dominant Hand: Right    Extremity/Trunk Assessment   Upper Extremity Assessment Upper Extremity Assessment: Overall  WFL for tasks assessed    Lower Extremity Assessment Lower Extremity Assessment: Overall WFL for tasks assessed;RLE deficits/detail RLE Deficits / Details: good quad  activation, no extensor lag with SLR    Cervical / Trunk Assessment Cervical / Trunk Assessment: Other exceptions Cervical / Trunk Exceptions: large body habitus  Communication   Communication: No difficulties  Cognition Arousal/Alertness: Awake/alert Behavior During Therapy: WFL for tasks assessed/performed Overall Cognitive Status: Within Functional Limits for tasks assessed       General Comments      Exercises Total Joint Exercises Ankle Circles/Pumps: Both;AROM;20 reps;Seated Quad Sets: AROM;Right;5 reps;Seated Heel Slides: AROM;Right;5 reps;Seated   Assessment/Plan    PT Assessment Patient needs continued PT services  PT Problem List Decreased strength;Decreased range of motion;Decreased activity tolerance;Decreased balance;Decreased mobility;Decreased knowledge of use of DME;Obesity;Pain       PT Treatment Interventions DME instruction;Gait training;Stair training;Functional mobility training;Therapeutic activities;Therapeutic exercise;Balance training;Patient/family education    PT Goals (Current goals can be found in the Care Plan section)  Acute Rehab PT Goals Patient Stated Goal: to have less knee pain and get independent again PT Goal Formulation: With patient Time For Goal Achievement: 06/02/19 Potential to Achieve Goals: Good    Frequency 7X/week    AM-PAC PT "6 Clicks" Mobility  Outcome Measure Help needed turning from your back to your side while in a flat bed without using bedrails?: None Help needed moving from lying on your back to sitting on the side of a flat bed without using bedrails?: None Help needed moving to and from a bed to a chair (including a wheelchair)?: A Little Help needed standing up from a chair using your arms (e.g., wheelchair or bedside chair)?: A Little Help needed to walk in hospital room?: A Little Help needed climbing 3-5 steps with a railing? : A Little 6 Click Score: 20    End of Session Equipment Utilized During  Treatment: Gait belt Activity Tolerance: Patient tolerated treatment well Patient left: in chair;with call bell/phone within reach;with chair alarm set;with family/visitor present Nurse Communication: Mobility status PT Visit Diagnosis: Muscle weakness (generalized) (M62.81);Difficulty in walking, not elsewhere classified (R26.2)    Time: 4403-4742 PT Time Calculation (min) (ACUTE ONLY): 40 min   Charges:   PT Evaluation $PT Eval Low Complexity: 1 Low PT Treatments $Gait Training: 8-22 mins $Therapeutic Exercise: 23-37 mins        Wynn Maudlin, DPT Physical Therapist with [facility] [phone]  05/26/2019 6:36 PM

## 2019-05-26 NOTE — Transfer of Care (Signed)
Immediate Anesthesia Transfer of Care Note  Patient: Kim Brock  Procedure(s) Performed: UNICOMPARTMENTAL KNEE MEDIALLY (Right Knee)  Patient Location: PACU  Anesthesia Type:Spinal and MAC combined with regional for post-op pain  Level of Consciousness: awake, alert , oriented and patient cooperative  Airway & Oxygen Therapy: Patient Spontanous Breathing and Patient connected to face mask oxygen  Post-op Assessment: Report given to RN and Post -op Vital signs reviewed and stable  Post vital signs: Reviewed and stable  Last Vitals:  Vitals Value Taken Time  BP 120/71 05/26/19 1222  Temp    Pulse 77 05/26/19 1226  Resp 18 05/26/19 1226  SpO2 100 % 05/26/19 1226  Vitals shown include unvalidated device data.  Last Pain:  Vitals:   05/26/19 0828  TempSrc:   PainSc: 5       Patients Stated Pain Goal: 4 (09/81/19 1478)  Complications: No apparent anesthesia complications

## 2019-05-26 NOTE — Anesthesia Preprocedure Evaluation (Addendum)
Anesthesia Evaluation  Patient identified by MRN, date of birth, ID band Patient awake    Reviewed: Allergy & Precautions, NPO status , Patient's Chart, lab work & pertinent test results  History of Anesthesia Complications (+) PONV and history of anesthetic complications  Airway Mallampati: III  TM Distance: >3 FB Neck ROM: Full    Dental  (+) Dental Advisory Given   Pulmonary asthma , sleep apnea and Continuous Positive Airway Pressure Ventilation , former smoker,    breath sounds clear to auscultation       Cardiovascular hypertension, Pt. on medications (-) angina(-) Past MI and (-) CHF  Rhythm:Regular     Neuro/Psych PSYCHIATRIC DISORDERS Anxiety Depression negative neurological ROS     GI/Hepatic negative GI ROS, Neg liver ROS,   Endo/Other  diabetesMorbid obesity  Renal/GU negative Renal ROS     Musculoskeletal  (+) Arthritis ,   Abdominal   Peds  Hematology   Anesthesia Other Findings   Reproductive/Obstetrics                            Anesthesia Physical Anesthesia Plan  ASA: III  Anesthesia Plan: MAC, Regional and Spinal   Post-op Pain Management:  Regional for Post-op pain   Induction:   PONV Risk Score and Plan: 3 and Propofol infusion and Treatment may vary due to age or medical condition  Airway Management Planned: Nasal Cannula  Additional Equipment: None  Intra-op Plan:   Post-operative Plan:   Informed Consent: I have reviewed the patients History and Physical, chart, labs and discussed the procedure including the risks, benefits and alternatives for the proposed anesthesia with the patient or authorized representative who has indicated his/her understanding and acceptance.     Dental advisory given  Plan Discussed with: CRNA and Surgeon  Anesthesia Plan Comments:         Anesthesia Quick Evaluation

## 2019-05-26 NOTE — Progress Notes (Signed)
AssistedDr. Moser with right, ultrasound guided, adductor canal block. Side rails up, monitors on throughout procedure. See vital signs in flow sheet. Tolerated Procedure well.  

## 2019-05-26 NOTE — Op Note (Signed)
NAME: Kim Brock [employer]    MEDICAL RECORD NO.: [id number]   FACILITY: [facility]   DATE OF BIRTH: 1960-12-08  PHYSICIAN: Madlyn Frankel. Charlann Boxer, M.D.    DATE OF PROCEDURE: 05/26/2019    OPERATIVE REPORT   PREOPERATIVE DIAGNOSIS: Right knee medial compartment osteoarthritis.   POSTOPERATIVE DIAGNOSIS: Right knee medial compartment osteoarthritis.   PROCEDURE: right partial knee replacement utilizing Biomet Oxford knee  component, size small femur, a right medial size B tibial tray with a size 5 insert.   SURGEON: Madlyn Frankel. Charlann Boxer, M.D.   ASSISTANT: Dennie Bible, [facility].  Please note that Ms Kim Brock was present for the entirety of the case,  utilized for preoperative positioning, perioperative retractor  management, general facilitation of the case and primary wound closure.   ANESTHESIA: Regional plus spinal.   SPECIMENS: None.   COMPLICATIONS: None.  DRAINS: None   TOURNIQUET TIME: 37 minutes at 250 mmHg.   INDICATIONS FOR PROCEDURE: The patient is a 59 y.o. patient of mine who presented for evaluation of right knee pain.  They presented with primary complaints of pain on the medial side of their knee. Radiographs revealed advanced medial compartment arthritis with specifically an antero-medial wear pattern.  There was bone on bone changes noted with subchondral sclerosis and osteophytes present. The patient has had progressive problems failing to respond to conservative measures of medications, injections and activity modification. Risks of infection, DVT, component failure, need for future revision surgery were all discussed and reviewed.  Consent was obtained for benefit of pain relief.   PROCEDURE IN DETAIL: The patient was brought to the operative theater.  Once adequate anesthesia, preoperative antibiotics, 3 gm of Ancef administered, 1 gm of Tranexamic Acid, and 10 mg of Decadron given the patient was positioned in supine position with a right thigh tourniquet placed. The operative  leg was positioned over the Oxford leg holder such to allow for 120 degrees of flexion. The right lower extremity was prepped and draped in sterile fashion. A time-out  was performed identifying the patient, planned procedure, and extremity.  The operative leg was exsanguinated and the tourniquet elevated to 250 mmHg. A midline  incision was made from the proximal pole of the patella to the tibial tubercle. A  soft tissue plane was created and partial median arthrotomy was then  made to allow for subluxation of the patella. Following initial synovectomy and  debridement, the osteophytes were removed off the medial aspect of the knee and within the notch as needed.  The femur was first sized using the sizing spoons and determine to be a small femur.  The femoral spoon was then left in place to be attached to the extra medullary tibial guide.  The tibial extramedullary guide was positioned over the anterior crest of the tibia  and pinned into position, perpendicular in the coronal plane with appropriate slope.  Utilizing the size 4 G clamp I connected the cutting block tray to the femoral spine.  Upon confirming the orientation of the cutting block as it pertained to the extra medullary guide the tibial tray was pinned in place.  I then used a reciprocating saw along the medial border of the medial tibial spine.  The reciprocating saw was then used to complete the proximal tibial osteotomy.  The proximal tibial bone was removed and identified to have complete loss of cartilage and anterior medial pattern.  The cut surface of the tibia was found to be best covered with the B tibial tray.  At this point  I checked the tension of the medial collateral ligament at 90 degrees.  With the retractors out of the wound and the knee held at 90 degrees the 4 feeler gauge had appropriate tension on the medial ligament.   At this point, the femoral canal was opened with a 6 mm drill followed by the starting awl and then  the  intramedullary was positioned within the canal.  Then the small posterior cutting block guide set at 4 to match the flexion gap was positioned onto the cut surface of the tibia and then linked to the intramedullary rod using the articulating link.  This posterior cutting block guide was positioned over the middle portion of the medial femoral condyle in line with the normal kinematic motion of the knee.  The 2 drill holes were then made into the distal femur.  Once this was done the posterior cutting block guide and length were removed.    The posterior guide was then impacted into place on the distal femur and the posterior  femoral cut made.   At this point, based on the tension on the medial collateral ligament at 90 degrees as previously assessed I selected the size 4 spigot and pushed it to the distal femur.  I then milled the distal femur.  Excess of bone from the distal femur and then medially and laterally were removed using osteotome and rondure.  We now did our first trial reduction with the small femur and the B tibial tray.  I now assessed the ligament balancing at 90 and 20 degrees of flexion.    I found that the tension at 90 degrees was appropriate with the 5 feeler gauge but found that the tension at 20 degrees matched the tension of the medial collateral ligament with the 4 feeler gauge.  Per the protocol based on the mismatch of tensioning at 90 and 20 degrees of flexion I placed the 5 spigot and re-milled the distal femur.  Once the remaining bone off the distal femur was removed a repeat trial reduction was carried out.  At this point I found that the medial collateral ligament tension was symmetric at 90 and 20 degrees of flexion with the 5 feeler gauge.    Given these findings, the trial femoral component was removed. Final preparation of tibia was carried out by pinning it in position. Then  using a reciprocating saw I removed bone for the keel. Further bone was  removed with  an osteotome.  Trial reduction was now carried out with the small femur, the right keeled B tibia, and the 5 lollipop insert. The balance of the  ligaments appeared to be symmetric at 20 degrees and 90 degrees. Given  all these findings, the trial components were removed.  The final components were opened. The knee was irrigated with  normal saline solution.  The medial synovial capsule junction of the knee was injected with 30 cc of quarter percent Marcaine with epinephrine, 1 cc of Toradol, and 30 cc of normal saline.  Any final debridements of the soft tissue or osteophytes was carried out at this point.  Sclerotic bone on the distal femur and proximal tibia were drilled with the drill and the set.    The final components were cemented onto a clean and dried cut surfaces of bone. Excessive cement was removed and the knee was then held at 45 degrees of flexion with the size size 5 feeler gauge until the cement cured. Excess cement was removed throughout the  knee. Tourniquet was let down  after 37 minutes. After the cement had fully cured and excessive cement  was removed throughout the knee there was no visualized cement present.  Before selecting the final insert re-trialed with the size 5 lollipop insert.  Given that these findings confirmed our initial trial reduction we selected the final size size 5 mm, right medial insert to match the smallfemur.  Following irrigation of the knee this final insert was snapped into place.   The extensor mechanism  was then reapproximated using a #1 Vicryl and #1 Stratafix suture with the knee in flexion. The  remaining wound was closed with 2-0 Vicryl and a running 4-0 Monocryl.  The knee was cleaned, dried, and dressed sterilely using Dermabond and  Aquacel dressing. The patient  was brought to the recovery room, Ace wrap in place, tolerating the  procedure well.     Madlyn Frankel Charlann Boxer, M.D.

## 2019-05-26 NOTE — Anesthesia Procedure Notes (Signed)
Spinal  Patient location during procedure: OR Start time: 05/26/2019 10:01 AM End time: 05/26/2019 10:07 AM Staffing Performed: anesthesiologist  Anesthesiologist: Val Eagle, MD Preanesthetic Checklist Completed: patient identified, IV checked, risks and benefits discussed, surgical consent, monitors and equipment checked, pre-op evaluation and timeout performed Spinal Block Patient position: sitting Prep: DuraPrep Patient monitoring: heart rate, cardiac monitor, continuous pulse ox and blood pressure Approach: midline Location: L4-5 Injection technique: single-shot Needle Needle type: Pencan  Needle gauge: 24 G Needle length: 9 cm Assessment Sensory level: T6

## 2019-05-26 NOTE — Anesthesia Procedure Notes (Signed)
Procedure Name: MAC Date/Time: 05/26/2019 10:00 AM Performed by: West Pugh, CRNA Pre-anesthesia Checklist: Patient identified, Emergency Drugs available, Suction available, Patient being monitored and Timeout performed Patient Re-evaluated:Patient Re-evaluated prior to induction Oxygen Delivery Method: Simple face mask Preoxygenation: Pre-oxygenation with 100% oxygen Induction Type: IV induction Placement Confirmation: positive ETCO2 Dental Injury: Teeth and Oropharynx as per pre-operative assessment

## 2019-05-26 NOTE — Discharge Instructions (Signed)

## 2019-05-27 ENCOUNTER — Encounter: Payer: Self-pay | Admitting: *Deleted

## 2019-05-27 DIAGNOSIS — M1711 Unilateral primary osteoarthritis, right knee: Secondary | ICD-10-CM | POA: Diagnosis not present

## 2019-05-27 LAB — CBC
HCT: 37.6 % (ref 36.0–46.0)
Hemoglobin: 12.5 g/dL (ref 12.0–15.0)
MCH: 30.3 pg (ref 26.0–34.0)
MCHC: 33.2 g/dL (ref 30.0–36.0)
MCV: 91.3 fL (ref 80.0–100.0)
Platelets: 221 10*3/uL (ref 150–400)
RBC: 4.12 MIL/uL (ref 3.87–5.11)
RDW: 12.4 % (ref 11.5–15.5)
WBC: 11.4 10*3/uL — ABNORMAL HIGH (ref 4.0–10.5)
nRBC: 0 % (ref 0.0–0.2)

## 2019-05-27 LAB — GLUCOSE, CAPILLARY
Glucose-Capillary: 142 mg/dL — ABNORMAL HIGH (ref 70–99)
Glucose-Capillary: 167 mg/dL — ABNORMAL HIGH (ref 70–99)

## 2019-05-27 LAB — BASIC METABOLIC PANEL
Anion gap: 9 (ref 5–15)
BUN: 17 mg/dL (ref 6–20)
CO2: 25 mmol/L (ref 22–32)
Calcium: 9 mg/dL (ref 8.9–10.3)
Chloride: 102 mmol/L (ref 98–111)
Creatinine, Ser: 0.83 mg/dL (ref 0.44–1.00)
GFR calc Af Amer: >60 mL/min (ref >60)
GFR calc non Af Amer: >60 mL/min (ref >60)
Glucose, Bld: 160 mg/dL — ABNORMAL HIGH (ref 70–99)
Potassium: 3.6 mmol/L (ref 3.5–5.1)
Sodium: 136 mmol/L (ref 135–145)

## 2019-05-27 MED ORDER — ASPIRIN 81 MG PO CHEW: 81.0000 mg | CHEWABLE_TABLET | Freq: Two times a day (BID) | ORAL | 0 refills | Status: AC

## 2019-05-27 MED ORDER — POLYETHYLENE GLYCOL 3350 17 G PO PACK: 17.0000 g | PACK | Freq: Two times a day (BID) | ORAL | 0 refills | Status: DC

## 2019-05-27 MED ORDER — FERROUS SULFATE 325 (65 FE) MG PO TABS: 325.0000 mg | ORAL_TABLET | Freq: Three times a day (TID) | ORAL | 0 refills | Status: DC

## 2019-05-27 MED ORDER — CELECOXIB 200 MG PO CAPS
200.0000 mg | ORAL_CAPSULE | Freq: Two times a day (BID) | ORAL | 0 refills | Status: DC
Start: 2019-05-27 — End: 2020-02-10

## 2019-05-27 MED ORDER — DOCUSATE SODIUM 100 MG PO CAPS: 100.0000 mg | ORAL_CAPSULE | Freq: Two times a day (BID) | ORAL | 0 refills | Status: DC

## 2019-05-27 MED ORDER — CEPHALEXIN 500 MG PO CAPS: 500.0000 mg | ORAL_CAPSULE | Freq: Three times a day (TID) | ORAL | 0 refills | Status: DC

## 2019-05-27 MED ORDER — HYDROCODONE-ACETAMINOPHEN 5-325 MG PO TABS: 1.0000 | ORAL_TABLET | ORAL | 0 refills | Status: DC | PRN

## 2019-05-27 MED ORDER — METHOCARBAMOL 500 MG PO TABS: 500.0000 mg | ORAL_TABLET | Freq: Four times a day (QID) | ORAL | 0 refills | Status: DC | PRN

## 2019-05-27 NOTE — Progress Notes (Signed)
Physical Therapy Treatment Patient Details Name: Kim Brock MRN: [id number] DOB: November 30, 1960 Today's Date: 05/27/2019    History of Present Illness Patient is 59 y.o. female s/p Rt UKA on 05/26/19 with PMH significant for OSA, Obesisty, HTN, glaucoma, depression, DM, anxiety, OA, Lt UKA in 2018, laproscopic gastric band in 2014.    PT Comments    Pt motivated and progressing well with mobility despite increased pain.     Follow Up Recommendations  Follow surgeon's recommendation for DC plan and follow-up therapies     Equipment Recommendations  None recommended by PT    Recommendations for Other Services       Precautions / Restrictions Precautions Precautions: Fall Restrictions Weight Bearing Restrictions: No    Mobility  Bed Mobility Overal bed mobility: Needs Assistance Bed Mobility: Supine to Sit     Supine to sit: Supervision;HOB elevated     General bed mobility comments: min cues for sequence and use of L LE to self assist  Transfers Overall transfer level: Needs assistance Equipment used: Rolling walker (2 wheeled) Transfers: Sit to/from Stand Sit to Stand: Min guard         General transfer comment: cues for LE management and use of UEs to self assist  Ambulation/Gait Ambulation/Gait assistance: Min guard Gait Distance (Feet): 20 Feet Assistive device: Rolling walker (2 wheeled) Gait Pattern/deviations: Step-to pattern;Decreased stride length;Wide base of support Gait velocity: decreased   General Gait Details: cues for posture, position from RW and initial sequence   Stairs             Wheelchair Mobility    Modified Rankin (Stroke Patients Only)       Balance Overall balance assessment: Needs assistance Sitting-balance support: Feet supported Sitting balance-Leahy Scale: Good     Standing balance support: During functional activity;Bilateral upper extremity supported Standing balance-Leahy Scale: Fair                               Cognition Arousal/Alertness: Awake/alert Behavior During Therapy: WFL for tasks assessed/performed Overall Cognitive Status: Within Functional Limits for tasks assessed                                        Exercises Total Joint Exercises Ankle Circles/Pumps: Both;AROM;20 reps;Seated Quad Sets: AROM;Right;10 reps;Supine Heel Slides: AROM;Right;Seated;15 reps Straight Leg Raises: AAROM;AROM;Right;15 reps;Supine Long Arc Quad: AAROM;AROM;Right;10 reps;Seated    General Comments        Pertinent Vitals/Pain Pain Assessment: 0-10 Pain Score: 5  Pain Location: Rt knee Pain Descriptors / Indicators: Aching;Sore Pain Intervention(s): Limited activity within patient's tolerance;Monitored during session;Premedicated before session    Home Living                      Prior Function            PT Goals (current goals can now be found in the care plan section) Acute Rehab PT Goals Patient Stated Goal: to have less knee pain and get independent again PT Goal Formulation: With patient Time For Goal Achievement: 06/02/19 Potential to Achieve Goals: Good Progress towards PT goals: Progressing toward goals    Frequency    7X/week      PT Plan Current plan remains appropriate    Co-evaluation  AM-PAC PT "6 Clicks" Mobility   Outcome Measure  Help needed turning from your back to your side while in a flat bed without using bedrails?: None Help needed moving from lying on your back to sitting on the side of a flat bed without using bedrails?: None Help needed moving to and from a bed to a chair (including a wheelchair)?: A Little Help needed standing up from a chair using your arms (e.g., wheelchair or bedside chair)?: A Little Help needed to walk in hospital room?: A Little Help needed climbing 3-5 steps with a railing? : A Little 6 Click Score: 20    End of Session Equipment Utilized During Treatment:  Gait belt Activity Tolerance: Patient tolerated treatment well Patient left: Other (comment)(bathroom) Nurse Communication: Mobility status PT Visit Diagnosis: Muscle weakness (generalized) (M62.81);Difficulty in walking, not elsewhere classified (R26.2)     Time: 0569-7948 PT Time Calculation (min) (ACUTE ONLY): 27 min  Charges:  $Gait Training: 8-22 mins $Therapeutic Exercise: 8-22 mins                     [place] Pager [phone] Office [phone]    Rebekah Sprinkle 05/27/2019, 12:45 PM

## 2019-05-27 NOTE — Progress Notes (Signed)
Physical Therapy Treatment Patient Details Name: Kim Brock MRN: [id number] DOB: 04/30/1960 Today's Date: 05/27/2019    History of Present Illness Patient is 59 y.o. female s/p Rt UKA on 05/26/19 with PMH significant for OSA, Obesisty, HTN, glaucoma, depression, DM, anxiety, OA, Lt UKA in 2018, laproscopic gastric band in 2014.    PT Comments    Pt continues to progress well with mobility and eager for dc home.  Pt navigated stairs and reviewed written HEP.   Follow Up Recommendations  Follow surgeon's recommendation for DC plan and follow-up therapies     Equipment Recommendations  None recommended by PT    Recommendations for Other Services       Precautions / Restrictions Precautions Precautions: Fall Restrictions Weight Bearing Restrictions: No    Mobility  Bed Mobility Overal bed mobility: Needs Assistance Bed Mobility: Supine to Sit     Supine to sit: Supervision;HOB elevated     General bed mobility comments: min cues for sequence and use of L LE to self assist  Transfers Overall transfer level: Needs assistance Equipment used: Rolling walker (2 wheeled) Transfers: Sit to/from Stand Sit to Stand: Supervision         General transfer comment: cues for LE management and use of UEs to self assist  Ambulation/Gait Ambulation/Gait assistance: Min guard;Supervision Gait Distance (Feet): 160 Feet Assistive device: Rolling walker (2 wheeled) Gait Pattern/deviations: Step-to pattern;Decreased stride length;Wide base of support Gait velocity: decreased   General Gait Details: cues for posture, position from RW and initial sequence   Stairs Stairs: Yes Stairs assistance: Min assist Stair Management: No rails;Step to pattern;Backwards;Forwards;With walker Number of Stairs: 6 General stair comments: 2 steps twice bkwd and single step twice fwd; cues for sequence and foot/RW placment   Wheelchair Mobility    Modified Rankin (Stroke Patients  Only)       Balance Overall balance assessment: Needs assistance Sitting-balance support: Feet supported Sitting balance-Leahy Scale: Good     Standing balance support: During functional activity;Bilateral upper extremity supported Standing balance-Leahy Scale: Fair                              Cognition Arousal/Alertness: Awake/alert Behavior During Therapy: WFL for tasks assessed/performed Overall Cognitive Status: Within Functional Limits for tasks assessed                                        Exercises Total Joint Exercises Ankle Circles/Pumps: Both;AROM;20 reps;Seated Quad Sets: AROM;Right;10 reps;Supine Heel Slides: AROM;Right;Seated;15 reps Straight Leg Raises: AAROM;AROM;Right;15 reps;Supine Long Arc Quad: AAROM;AROM;Right;10 reps;Seated    General Comments        Pertinent Vitals/Pain Pain Assessment: 0-10 Pain Score: 5  Pain Location: Rt knee Pain Descriptors / Indicators: Aching;Sore Pain Intervention(s): Limited activity within patient's tolerance;Monitored during session;Premedicated before session;Ice applied    Home Living                      Prior Function            PT Goals (current goals can now be found in the care plan section) Acute Rehab PT Goals Patient Stated Goal: to have less knee pain and get independent again PT Goal Formulation: With patient Time For Goal Achievement: 06/02/19 Potential to Achieve Goals: Good Progress towards PT goals: Progressing toward goals  Frequency    7X/week      PT Plan Current plan remains appropriate    Co-evaluation              AM-PAC PT "6 Clicks" Mobility   Outcome Measure  Help needed turning from your back to your side while in a flat bed without using bedrails?: None Help needed moving from lying on your back to sitting on the side of a flat bed without using bedrails?: None Help needed moving to and from a bed to a chair (including a  wheelchair)?: A Little Help needed standing up from a chair using your arms (e.g., wheelchair or bedside chair)?: A Little Help needed to walk in hospital room?: A Little Help needed climbing 3-5 steps with a railing? : A Little 6 Click Score: 20    End of Session Equipment Utilized During Treatment: Gait belt Activity Tolerance: Patient tolerated treatment well Patient left: in chair Nurse Communication: Mobility status PT Visit Diagnosis: Muscle weakness (generalized) (M62.81);Difficulty in walking, not elsewhere classified (R26.2)     Time: 2993-7169 PT Time Calculation (min) (ACUTE ONLY): 34 min  Charges:  $Gait Training: 8-22 mins $Therapeutic Exercise: 8-22 mins $Therapeutic Activity: 8-22 mins                     Debe Coder PT Acute Rehabilitation Services Pager [phone] Office [phone]    Kim Brock 05/27/2019, 12:50 PM

## 2019-05-27 NOTE — Plan of Care (Signed)
  Problem: Education: Goal: Knowledge of the prescribed therapeutic regimen will improve Outcome: Adequate for Discharge   Problem: Activity: Goal: Ability to avoid complications of mobility impairment will improve Outcome: Adequate for Discharge   Problem: Activity: Goal: Range of joint motion will improve Outcome: Adequate for Discharge   Problem: Clinical Measurements: Goal: Postoperative complications will be avoided or minimized Outcome: Adequate for Discharge   Problem: Pain Management: Goal: Pain level will decrease with appropriate interventions Outcome: Adequate for Discharge

## 2019-05-27 NOTE — TOC Progression Note (Signed)
Transition of Care [facility]) - Progression Note    Patient Details  Name: NATILIE KRABBENHOFT MRN: [id number] Date of Birth: April 13, 1960  [facility] CM/SW Contact  Clearance Coots, LCSW Phone Number: 05/27/2019, 9:39 AM  Clinical Narrative:    Therapy Plan: OPPT Has RW, declines 3 in1      Barriers to Discharge: No Barriers Identified  Expected Discharge Plan and Services           Expected Discharge Date: 05/27/19               DME Arranged: N/A(Has RW declines a 3 in 1) DME Agency: NA       HH Arranged: NA HH Agency: NA         Social Determinants of Health (SDOH) Interventions    Readmission Risk Interventions No flowsheet data found.

## 2019-05-27 NOTE — Progress Notes (Signed)
     Subjective: 1 Day Post-Op Procedure(s) (LRB): UNICOMPARTMENTAL KNEE MEDIALLY (Right)   Patient reports pain as mild, pain controlled with medication.  No reported events throughout the night.  Dr. Charlann Boxer discussed the procedure, findings and expectations moving forward.  Patient has been through the other knee replacement so understands what she needs to do to proceed from here.  Patient is ready be discharged home, she does well therapy.  Patient follow-up in the clinic in 2 weeks.  Patient is to call with any questions or concerns.     Objective:   VITALS:   Vitals:   05/27/19 0230 05/27/19 0612  BP: 137/80 (!) 103/44  Pulse: 69 64  Resp: 18 18  Temp: 98.7 F (37.1 C) 97.6 F (36.4 C)  SpO2: 100% 100%    Dorsiflexion/Plantar flexion intact Incision: dressing C/D/I No cellulitis present Compartment soft  LABS Recent Labs    05/27/19 0253  HGB 12.5  HCT 37.6  WBC 11.4*  PLT 221    Recent Labs    05/27/19 0253  NA 136  K 3.6  BUN 17  CREATININE 0.83  GLUCOSE 160*     Assessment/Plan: 1 Day Post-Op Procedure(s) (LRB): UNICOMPARTMENTAL KNEE MEDIALLY (Right) Foley cath d/c'ed Advance diet Up with therapy D/C IV fluids Discharge home Follow up in 2 weeks at [facility] Follow up with OLIN,Salvatore Shear D in 2 weeks.  Contact information:  [facility] [address] [place] [address] [phone]    Morbid Obesity (BMI >40)  Estimated body mass index is 53.23 kg/m as calculated from the following:   Height as of this encounter: 5\' 4"  (1.626 m).   Weight as of this encounter: 140.7 kg. Patient also counseled that weight may inhibit the healing process Patient counseled that losing weight will help with future health issues            PA-C  [facility]  [place] [address], [address], [place], [place] Phone: [phone] [email or web address] Facebook  [phone]

## 2019-05-30 ENCOUNTER — Encounter: Payer: Self-pay | Admitting: Anesthesiology

## 2019-05-30 NOTE — Anesthesia Postprocedure Evaluation (Signed)
Anesthesia Post Note  Patient: Kim Brock  Procedure(s) Performed: UNICOMPARTMENTAL KNEE MEDIALLY (Right Knee)     Patient location during evaluation: PACU Anesthesia Type: Regional, MAC and Spinal Level of consciousness: awake and alert Pain management: pain level controlled Vital Signs Assessment: post-procedure vital signs reviewed and stable Respiratory status: spontaneous breathing, nonlabored ventilation, respiratory function stable and patient connected to nasal cannula oxygen Cardiovascular status: stable and blood pressure returned to baseline Postop Assessment: no apparent nausea or vomiting and spinal receding Anesthetic complications: no    Last Vitals:  Vitals:   05/27/19 0612 05/27/19 1049  BP: (!) 103/44 118/64  Pulse: 64 67  Resp: 18 20  Temp: 36.4 C 36.9 C  SpO2: 100% 93%    Last Pain:  Vitals:   05/27/19 1247  TempSrc:   PainSc: 5                  Drakkar Medeiros

## 2019-06-07 NOTE — Discharge Summary (Signed)
Physician Discharge Summary  Patient ID: Kim Brock MRN: [id number] DOB/AGE: September 14, 1960 59 y.o.  Admit date: 05/26/2019 Discharge date: 05/27/2019   Procedures:  Procedure(s) (LRB): UNICOMPARTMENTAL KNEE MEDIALLY (Right)  Attending Physician:  Dr. Paralee Cancel   Admission Diagnoses:   Right knee medial compartmental primary OA /pain  Discharge Diagnoses:  Principal Problem:   S/P right UKR  Past Medical History:  Diagnosis Date  . Allergic rhinitis   . Anxiety   . Arthritis    oa both knees  . Asthma   . Bronchospasm    With URI  . Depression   . Depression with anxiety   . Diabetes mellitus without complication (HCC)    diet controlled  . 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 Pupil looks dilated always  . Hypertension   . Morbid obesity with BMI of 50.0-59.9, adult (HCC)    BMI 59.3  . Morbid obesity with BMI of 50.0-59.9, adult ([facility])   . Near sighted    Wears contacts  . OSA on CPAP    on auto CPAP for life  . PONV (postoperative nausea and vomiting)    nausea likes zofran, pt has motion sickness  . Vitamin D deficiency     HPI:    Kim Brock, 59 y.o. female , has a history of pain and functional disability in the right and has failed non-surgical conservative treatments for greater than 12 weeks to include NSAID's and/or analgesics, corticosteriod injections, viscosupplementation injections and activity modification.  Onset of symptoms was gradual, starting 5+ years ago with gradually worsening course since that time. The patient noted prior procedures on the knee to include  arthroscopy on the right knee(s).  Patient currently rates pain in the right knee(s) at 9 out of 10 with activity. Patient has night pain, worsening of pain with activity and weight bearing, pain that interferes with activities of daily living, pain with passive range of motion, crepitus and joint swelling.  Patient has evidence of periarticular  osteophytes and joint space narrowing of the medial compartment by imaging studies.  There is no active infection.  Risks, benefits and expectations were discussed with the patient.  Risks including but not limited to the risk of anesthesia, blood clots, nerve damage, blood vessel damage, failure of the prosthesis, infection and up to and including death.  Patient understand the risks, benefits and expectations and wishes to proceed with surgery.   PCP: Harlan Stains, MD   Discharged Condition: good  Hospital Course:  Patient underwent the above stated procedure on 05/26/2019. Patient tolerated the procedure well and brought to the recovery room in good condition and subsequently to the floor.  POD #1 BP: 103/44 ; Pulse: 64 ; Temp: 97.6 F (36.4 C) ; Resp: 18 Patient reports pain as mild, pain controlled with medication.  No reported events throughout the night.  Dr. Alvan Dame discussed the procedure, findings and expectations moving forward.  Patient has been through the other knee replacement so understands what she needs to do to proceed from here.  Patient is ready be discharged home. Dorsiflexion/plantar flexion intact, incision: dressing C/D/I, no cellulitis present and compartment soft.   LABS  Basename    HGB     12.5  HCT     37.6    Discharge Exam: General appearance: alert, cooperative and no distress Extremities: Homans sign is negative, no sign of DVT, no edema, redness or tenderness in the calves or  thighs and no ulcers, gangrene or trophic changes  Disposition: Home with follow up in 2 weeks   Follow-up Information    Durene Romans, MD. Schedule an appointment as soon as possible for a visit in 2 weeks.   Specialty: Orthopedic Surgery Contact information: [address] [place] [address] [place] [address] [phone]           Discharge Instructions    Call MD / Call 911   Complete by: As directed    If you experience chest pain or shortness of breath, CALL 911  and be transported to the hospital emergency room.  If you develope a fever above 101 F, pus (white drainage) or increased drainage or redness at the wound, or calf pain, call your surgeon's office.   Change dressing   Complete by: As directed    Maintain surgical dressing until follow up in the clinic. If the edges start to pull up, may reinforce with tape. If the dressing is no longer working, may remove and cover with gauze and tape, but must keep the area dry and clean.  Call with any questions or concerns.   Constipation Prevention   Complete by: As directed    Drink plenty of fluids.  Prune juice may be helpful.  You may use a stool softener, such as Colace (over the counter) 100 mg twice a day.  Use MiraLax (over the counter) for constipation as needed.   Diet - low sodium heart healthy   Complete by: As directed    Discharge instructions   Complete by: As directed    Maintain surgical dressing until follow up in the clinic. If the edges start to pull up, may reinforce with tape. If the dressing is no longer working, may remove and cover with gauze and tape, but must keep the area dry and clean.  Follow up in 2 weeks at [facility]. Call with any questions or concerns.   Increase activity slowly as tolerated   Complete by: As directed    Weight bearing as tolerated with assist device (walker, cane, etc) as directed, use it as long as suggested by your surgeon or therapist, typically at least 4-6 weeks.   TED hose   Complete by: As directed    Use stockings (TED hose) for 2 weeks on both leg(s).  You may remove them at night for sleeping.      Allergies as of 05/27/2019      Reactions   Chlorhexidine Dermatitis, Rash   Tape Other (See Comments), Rash   Steri strips - blisters   Demerol [meperidine] Other (See Comments)   Blood pressure drops and makes me violently nauseous.   Epinephrine Other (See Comments)   In Novocaine - makes jittery and heart race   Percocet  [oxycodone-acetaminophen]    Hallucinate, Family h/o of poor tolerance.  Will not take - can tolerate Vicodin      Medication List    STOP taking these medications   diclofenac 75 MG EC tablet Commonly known as: VOLTAREN   HYDROcodone-acetaminophen 10-325 MG tablet Commonly known as: NORCO Replaced by: HYDROcodone-acetaminophen 5-325 MG tablet     TAKE these medications   albuterol 108 (90 Base) MCG/ACT inhaler Commonly known as: VENTOLIN HFA Inhale 2 puffs into the lungs every 4 (four) hours as needed for shortness of breath.   amLODipine 10 MG tablet Commonly known as: NORVASC Take 10 mg by mouth daily.   aspirin 81 MG chewable tablet Commonly known as: Aspirin Childrens  Chew 1 tablet (81 mg total) by mouth 2 (two) times daily. Take for 4 weeks, then resume regular dose.   atorvastatin 10 MG tablet Commonly known as: LIPITOR Take 10 mg by mouth daily.   celecoxib 200 MG capsule Commonly known as: CeleBREX Take 1 capsule (200 mg total) by mouth 2 (two) times daily.   cephALEXin 500 MG capsule Commonly known as: Keflex Take 1 capsule (500 mg total) by mouth 3 (three) times daily.   docusate sodium 100 MG capsule Commonly known as: Colace Take 1 capsule (100 mg total) by mouth 2 (two) times daily.   escitalopram 10 MG tablet Commonly known as: LEXAPRO Take 30 mg by mouth daily.   ferrous sulfate 325 (65 FE) MG tablet Commonly known as: FerrouSul Take 1 tablet (325 mg total) by mouth 3 (three) times daily with meals for 14 days.   HYDROcodone-acetaminophen 5-325 MG tablet Commonly known as: Norco Take 1-2 tablets by mouth every 4 (four) hours as needed for moderate pain or severe pain. Replaces: HYDROcodone-acetaminophen 10-325 MG tablet   KlonoPIN 0.5 MG tablet Generic drug: clonazePAM Take 0.25-0.5 mg by mouth at bedtime as needed for anxiety.   lamoTRIgine 100 MG tablet Commonly known as: LAMICTAL Take 100 mg by mouth daily.   latanoprost 0.005 %  ophthalmic solution Commonly known as: XALATAN Place 1 drop into the left eye at bedtime.   lisinopril-hydrochlorothiazide 20-25 MG tablet Commonly known as: ZESTORETIC Take 1 tablet by mouth daily.   loratadine 10 MG tablet Commonly known as: CLARITIN Take 10 mg by mouth daily as needed for allergies.   methocarbamol 500 MG tablet Commonly known as: Robaxin Take 1 tablet (500 mg total) by mouth every 6 (six) hours as needed for muscle spasms.   polyethylene glycol 17 g packet Commonly known as: MIRALAX / GLYCOLAX Take 17 g by mouth 2 (two) times daily.   Qvar 80 MCG/ACT inhaler Generic drug: beclomethasone Inhale 1-2 puffs into the lungs 2 (two) times daily as needed (shortness of breath).   Simbrinza 1-0.2 % Susp Generic drug: Brinzolamide-Brimonidine Place 1 drop into the left eye in the morning and at bedtime.            Discharge Care Instructions  (From admission, onward)         Start     Ordered   05/27/19 0000  Change dressing    Comments: Maintain surgical dressing until follow up in the clinic. If the edges start to pull up, may reinforce with tape. If the dressing is no longer working, may remove and cover with gauze and tape, but must keep the area dry and clean.  Call with any questions or concerns.   05/27/19 [id number]           Signed: Anastasio Auerbach. Malakhi Markwood   PA-C  06/07/2019, 1:32 PM

## 2019-08-23 ENCOUNTER — Encounter: Payer: Self-pay | Admitting: Cardiology

## 2019-08-23 ENCOUNTER — Other Ambulatory Visit: Payer: Self-pay

## 2019-08-23 ENCOUNTER — Telehealth (INDEPENDENT_AMBULATORY_CARE_PROVIDER_SITE_OTHER): Payer: Commercial Managed Care - PPO | Admitting: Cardiology

## 2019-08-23 ENCOUNTER — Telehealth: Payer: Self-pay | Admitting: *Deleted

## 2019-08-23 VITALS — BP 130/80 | HR 82 | Ht 64.0 in | Wt 305.0 lb

## 2019-08-23 DIAGNOSIS — Z9989 Dependence on other enabling machines and devices: Secondary | ICD-10-CM

## 2019-08-23 DIAGNOSIS — G4733 Obstructive sleep apnea (adult) (pediatric): Secondary | ICD-10-CM | POA: Diagnosis not present

## 2019-08-23 DIAGNOSIS — Z6841 Body Mass Index (BMI) 40.0 and over, adult: Secondary | ICD-10-CM

## 2019-08-23 DIAGNOSIS — I10 Essential (primary) hypertension: Secondary | ICD-10-CM

## 2019-08-23 NOTE — Progress Notes (Addendum)
Virtual Visit via Video Note   This visit type was conducted due to national recommendations for restrictions regarding the COVID-19 Pandemic (e.g. social distancing) in an effort to limit this patient's exposure and mitigate transmission in our community.  Due to her co-morbid illnesses, this patient is at least at moderate risk for complications without adequate follow up.  This format is felt to be most appropriate for this patient at this time.  All issues noted in this document were discussed and addressed.  A limited physical exam was performed with this format.  Please refer to the patient's chart for her consent to telehealth for [facility].  Evaluation Performed:  Follow-up visit  This visit type was conducted due to national recommendations for restrictions regarding the COVID-19 Pandemic (e.g. social distancing).  This format is felt to be most appropriate for this patient at this time.  All issues noted in this document were discussed and addressed.  No physical exam was performed (except for noted visual exam findings with Video Visits).  Please refer to the patient's chart (MyChart message for video visits and phone note for telephone visits) for the patient's consent to telehealth for [facility].  Date:  08/23/2019   ID:  Kim Brock, DOB 1960-04-05, MRN [id number]  Patient Location:  Home  Provider location:   [place]  PCP:  Laurann Montana, MD  Sleep Medicine:  Armanda Magic, MD Electrophysiologist:  None   Chief Complaint:  OSA  History of Present Illness:    Kim Brock is a 59 y.o. female who presents via audio/video conferencing for a telehealth visit today.    Kim Brock is a 59 y.o. female with a hx of OSA, HTN and morbid obesity. She is doing well with her CPAP device and thinks that she has gotten used to it.  She tolerates the mask and feels the pressure is adequate.  Since going on CPAP she feels rested in the am and has no  significant daytime sleepiness.  She denies any significant mouth or nasal dryness or nasal congestion.  She does not think that he snores.    The patient does not have symptoms concerning for COVID-19 infection (fever, chills, cough, or new shortness of breath).    Prior CV studies:   The following studies were reviewed today:  None  Past Medical History:  Diagnosis Date  . Allergic rhinitis   . Anxiety   . Arthritis    oa both knees  . Asthma   . Bronchospasm    With URI  . Depression   . Depression with anxiety   . Diabetes mellitus without complication (HCC)    diet controlled  . 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 Pupil looks dilated always  . Hypertension   . Morbid obesity with BMI of 50.0-59.9, adult (HCC)    BMI 59.3  . Morbid obesity with BMI of 50.0-59.9, adult (HCC)   . Near sighted    Wears contacts  . OSA on CPAP    on auto CPAP for life  . PONV (postoperative nausea and vomiting)    nausea likes zofran, pt has motion sickness  . Vitamin D deficiency    Past Surgical History:  Procedure Laterality Date  . CHOLECYSTECTOMY  1999  . DILATION AND CURETTAGE OF UTERUS  2011  . KNEE SURGERY  2009/2010   left and right  knees arthroscopic meniscal repair  . LAPAROSCOPIC GASTRIC BANDING  N/A 03/30/2012   Procedure: LAPAROSCOPIC GASTRIC BANDING;  Surgeon: Atilano Ina, MD,FACS;  Location: [facility];  Service: General;  Laterality: N/A;  Laparoscopic Adjustable Gastric Band Placement,   . MESH APPLIED TO LAP PORT N/A 03/30/2012   Procedure: MESH APPLIED TO LAP PORT;  Surgeon: Atilano Ina, MD,FACS;  Location: [facility];  Service: General;  Laterality: N/A;  . PARTIAL KNEE ARTHROPLASTY Left 07/14/2016   Procedure: UNICOMPARTMENTAL LEFT KNEE;  Surgeon: Durene Romans, MD;  Location: [facility];  Service: Orthopedics;  Laterality: Left;  . PARTIAL KNEE ARTHROPLASTY Right 05/26/2019   Procedure: UNICOMPARTMENTAL KNEE MEDIALLY;  Surgeon: Durene Romans, MD;  Location: [facility];  Service: Orthopedics;  Laterality: Right;  70 mins  . TUBAL LIGATION  2001  . uterine ablation     5 years ago     Current Meds  Medication Sig  . albuterol (PROVENTIL HFA;VENTOLIN HFA) 108 (90 BASE) MCG/ACT inhaler Inhale 2 puffs into the lungs every 4 (four) hours as needed for shortness of breath.   Marland Kitchen amLODipine (NORVASC) 10 MG tablet Take 10 mg by mouth daily.  Marland Kitchen atorvastatin (LIPITOR) 10 MG tablet Take 10 mg by mouth daily.  . Brinzolamide-Brimonidine (SIMBRINZA) 1-0.2 % SUSP Place 1 drop into the left eye in the morning and at bedtime.   . budesonide (PULMICORT) 0.5 MG/2ML nebulizer solution Take 0.5 mg by nebulization daily.  . clonazePAM (KLONOPIN) 0.5 MG tablet Take 0.25-0.5 mg by mouth at bedtime as needed for anxiety.   Marland Kitchen escitalopram (LEXAPRO) 10 MG tablet Take 30 mg by mouth daily.   Marland Kitchen HYDROcodone-acetaminophen (NORCO) 5-325 MG tablet Take 1-2 tablets by mouth every 4 (four) hours as needed for moderate pain or severe pain.  Marland Kitchen lamoTRIgine (LAMICTAL) 100 MG tablet Take 100 mg by mouth daily.  Marland Kitchen latanoprost (XALATAN) 0.005 % ophthalmic solution Place 1 drop into the left eye at bedtime.  Marland Kitchen lisinopril-hydrochlorothiazide (PRINZIDE,ZESTORETIC) 20-25 MG tablet Take 1 tablet by mouth daily.  Marland Kitchen loratadine (CLARITIN) 10 MG tablet Take 10 mg by mouth daily as needed for allergies.  . methocarbamol (ROBAXIN) 500 MG tablet Take 1 tablet (500 mg total) by mouth every 6 (six) hours as needed for muscle spasms.     Allergies:   Chlorhexidine, Tape, Demerol [meperidine], Epinephrine, Percocet [oxycodone-acetaminophen], and Keflex [cephalexin]   Social History   Tobacco Use  . Smoking status: Former Smoker    Packs/day: 1.00    Years: 11.00    Pack years: 11.00    Types: Cigarettes    Quit date: 08/06/1991    Years since quitting: 28.0  . Smokeless tobacco: Never Used  Vaping Use  . Vaping Use: Never used  Substance Use Topics  . Alcohol use: Yes     Alcohol/week: 1.0 - 2.0 standard drink    Types: 1 - 2 Glasses of wine per week    Comment: 3 drinks per week  . Drug use: No     Family Hx: The patient's family history includes Breast cancer (age of onset: 37) in her mother; Cancer in her paternal grandmother; Diabetes in her mother; Hyperlipidemia in her father; Hypertension in her mother; Osteoarthritis in her mother.  ROS:   Please see the history of present illness.     All other systems reviewed and are negative.   Labs/Other Tests and Data Reviewed:    Recent Labs: 05/27/2019: BUN 17; Creatinine, Ser 0.83; Hemoglobin 12.5; Platelets 221; Potassium 3.6; Sodium 136   Recent Lipid Panel Lab Results  Component Value Date/Time   CHOL 164 01/06/2012 09:25 AM   TRIG 157 (H) 01/06/2012 09:25 AM   HDL 62 01/06/2012 09:25 AM   CHOLHDL 2.6 01/06/2012 09:25 AM   LDLCALC 71 01/06/2012 09:25 AM    Wt Readings from Last 3 Encounters:  08/23/19 (!) 305 lb (138.3 kg)  05/26/19 (!) 310 lb 2 oz (140.7 kg)  05/18/19 (!) 310 lb 2 oz (140.7 kg)     Objective:    Vital Signs:  BP 130/80   Pulse 82   Ht 5\' 4"  (1.626 m)   Wt (!) 305 lb (138.3 kg)   BMI 52.35 kg/m    CONSTITUTIONAL:  Well nourished, well developed female in no acute distress.  EYES: anicteric MOUTH: oral mucosa is pink RESPIRATORY: Normal respiratory effort, symmetric expansion CARDIOVASCULAR: No peripheral edema SKIN: No rash, lesions or ulcers MUSCULOSKELETAL: no digital cyanosis NEURO: Cranial Nerves II-XII grossly intact, moves all extremities PSYCH: Intact judgement and insight.  A&O x 3, Mood/affect appropriate   ASSESSMENT & PLAN:    1.  OSA -  The patient is tolerating PAP therapy well without any problems.   The patient has been using and benefiting from PAP use and will continue to benefit from therapy. I will get a copy of her last download from DME. -she would like to get a new device so I will order a new ResMed CPAP on auto from 4-20cm H2O and  followup with me in 8 weeks  2.  Hypertension  -BP controled -continue Amlodipine 10mg  daily and Lisinopril-HCT 20-25mg  daily  3.  Morbid Obesity  -she recently has had surgery on her knee and is now walking outside for exercise  COVID-19 Education: The signs and symptoms of COVID-19 were discussed with the patient and how to seek care for testing (follow up with PCP or arrange E-visit).  The importance of social distancing was discussed today.  Patient Risk:   After full review of this patient's clinical status, I feel that they are at least moderate risk at this time.  Time:   Today, I have spent 20 minutes on telemedicine discussing medical problems including OSA, HTN, obesity.  We also reviewed the symptoms of COVID 19 and the ways to protect against contracting the virus with telehealth technology.    Medication Adjustments/Labs and Tests Ordered: Current medicines are reviewed at length with the patient today.  Concerns regarding medicines are outlined above.  Tests Ordered: No orders of the defined types were placed in this encounter.  Medication Changes: No orders of the defined types were placed in this encounter.   Disposition:  Follow up in 1 year(s)  Signed, , MD  08/23/2019 7:56 AM    [facility]

## 2019-08-23 NOTE — Patient Instructions (Signed)
Medication Instructions:  Your physician recommends that you continue on your current medications as directed. Please refer to the Current Medication list given to you today.  *If you need a refill on your cardiac medications before your next appointment, please call your pharmacy*  Follow-Up: At [facility], you and your health needs are our priority.  As part of our continuing mission to provide you with exceptional heart care, we have created designated Provider Care Teams.  These Care Teams include your primary Cardiologist (physician) and Advanced Practice Providers (APPs -  Physician Assistants and Nurse Practitioners) who all work together to provide you with the care you need, when you need it.  We recommend signing up for the patient portal called "MyChart".  Sign up information is provided on this After Visit Summary.  MyChart is used to connect with patients for Virtual Visits (Telemedicine).  Patients are able to view lab/test results, encounter notes, upcoming appointments, etc.  Non-urgent messages can be sent to your provider as well.   To learn more about what you can do with MyChart, go to [email or web address].    Your next appointment:   1 year(s)  The format for your next appointment:   Virtual Visit   Provider:   Traci Turner, MD    

## 2019-08-23 NOTE — Telephone Encounter (Signed)
Order placed to [facility] via community messsage 

## 2019-08-23 NOTE — Telephone Encounter (Signed)
-----   Message from Quintella Reichert, MD sent at 08/23/2019  8:05 AM EDT ----- Please order new ResMed CPAP on auto from 4-20cm H2O with heated humidity and mask of choice and set up visit in 8 weeks

## 2019-09-13 NOTE — Addendum Note (Signed)
Addended by: Reesa Chew on: 09/13/2019 06:44 PM   Modules accepted: Orders

## 2019-09-19 IMAGING — MG DIGITAL SCREENING BILATERAL MAMMOGRAM WITH TOMO AND CAD
6 of 10 series · 6 of 30 positions shown · non-contrast
Comparison: Previous exam(s).

ACR Breast Density Category a: The breast tissue is almost entirely
fatty.

CLINICAL DATA: Screening.

EXAM:
DIGITAL SCREENING BILATERAL MAMMOGRAM WITH TOMO AND CAD

[R CC synth-2D]
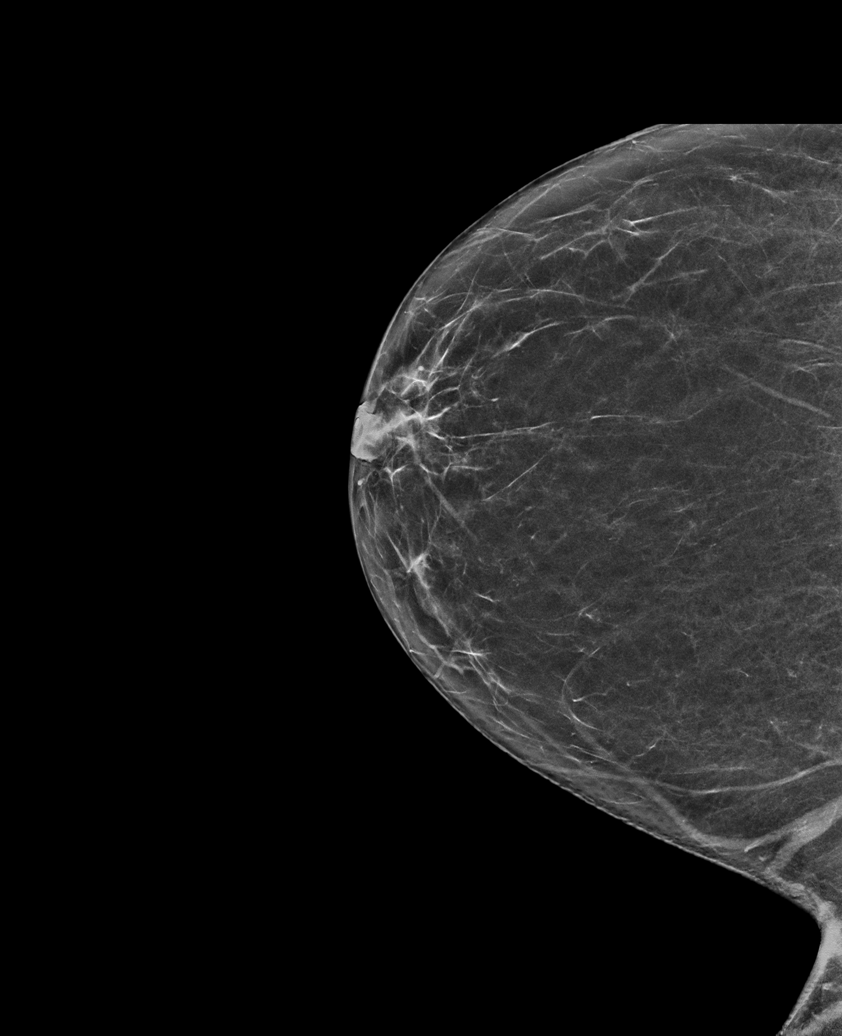

[R MLO synth-2D (1 of 2)]
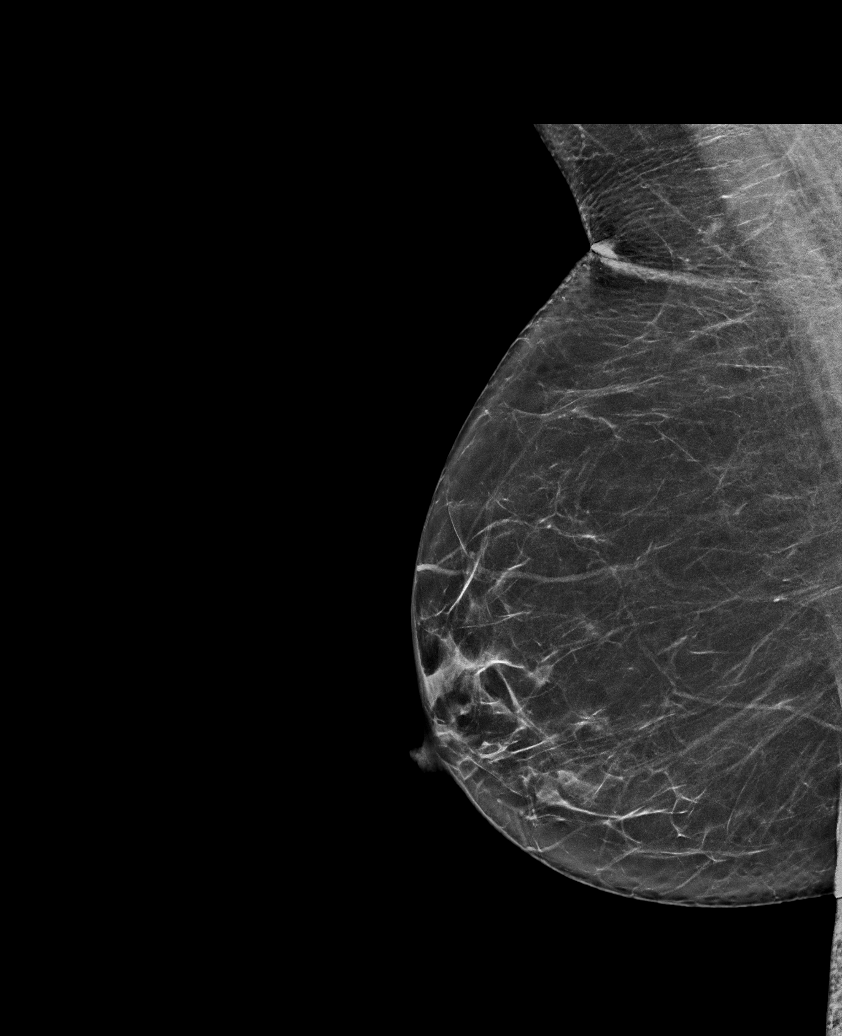

[R MLO synth-2D (2 of 2)]
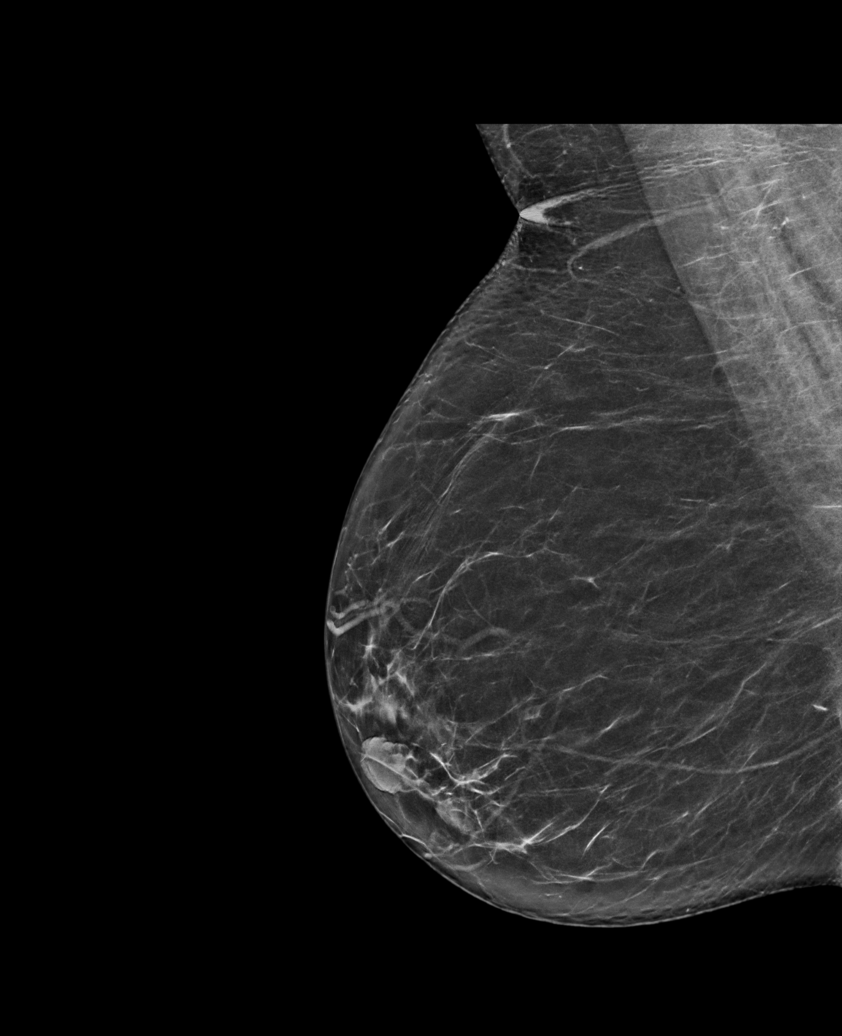

[L CC synth-2D]
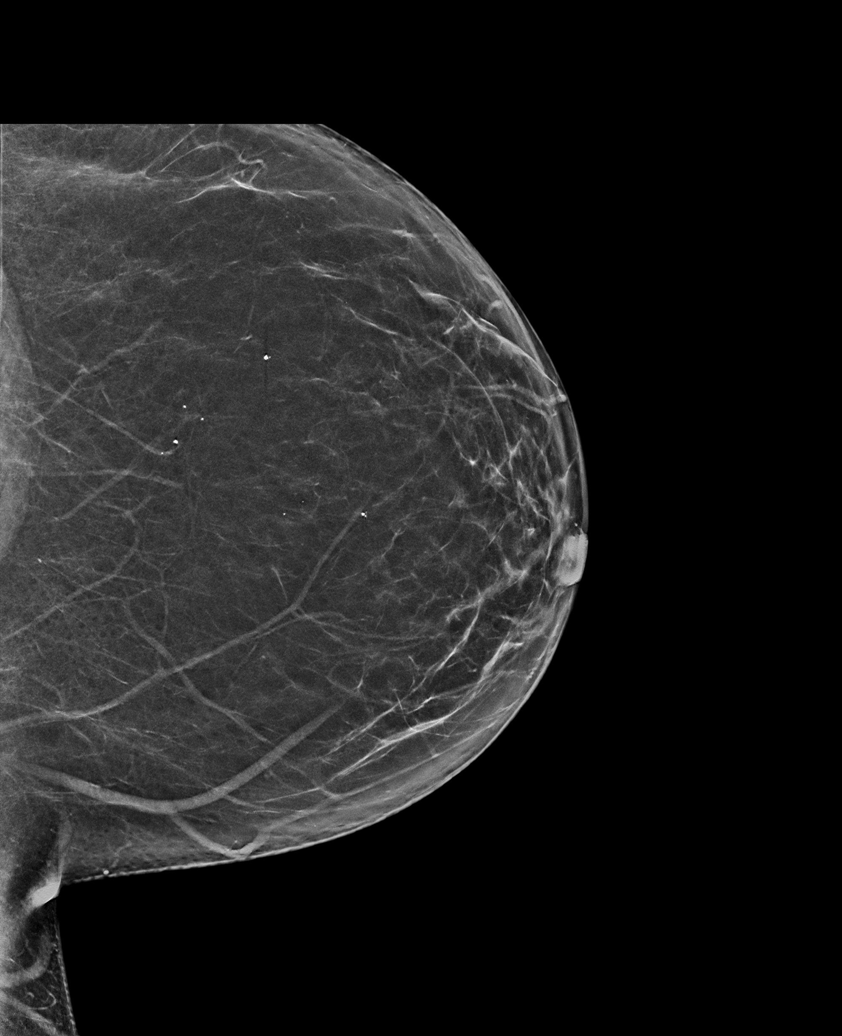

[L MLO synth-2D]
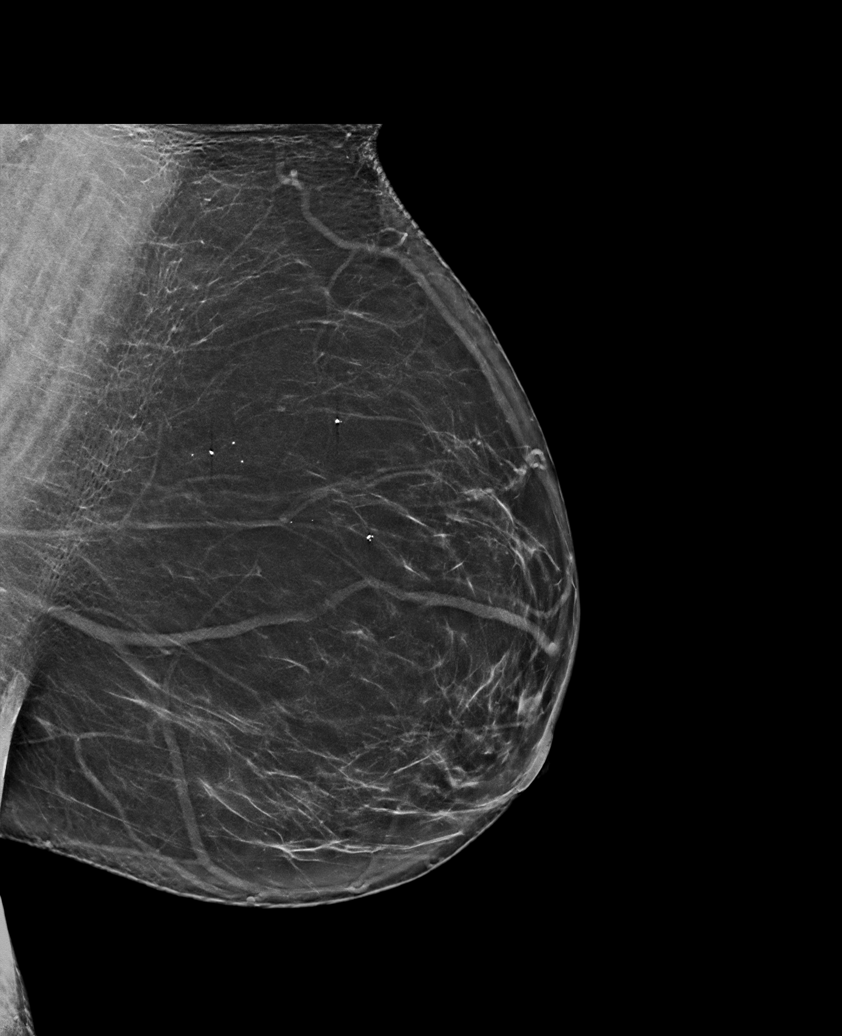

[L CC tomo · tomo slice 36/71.0]
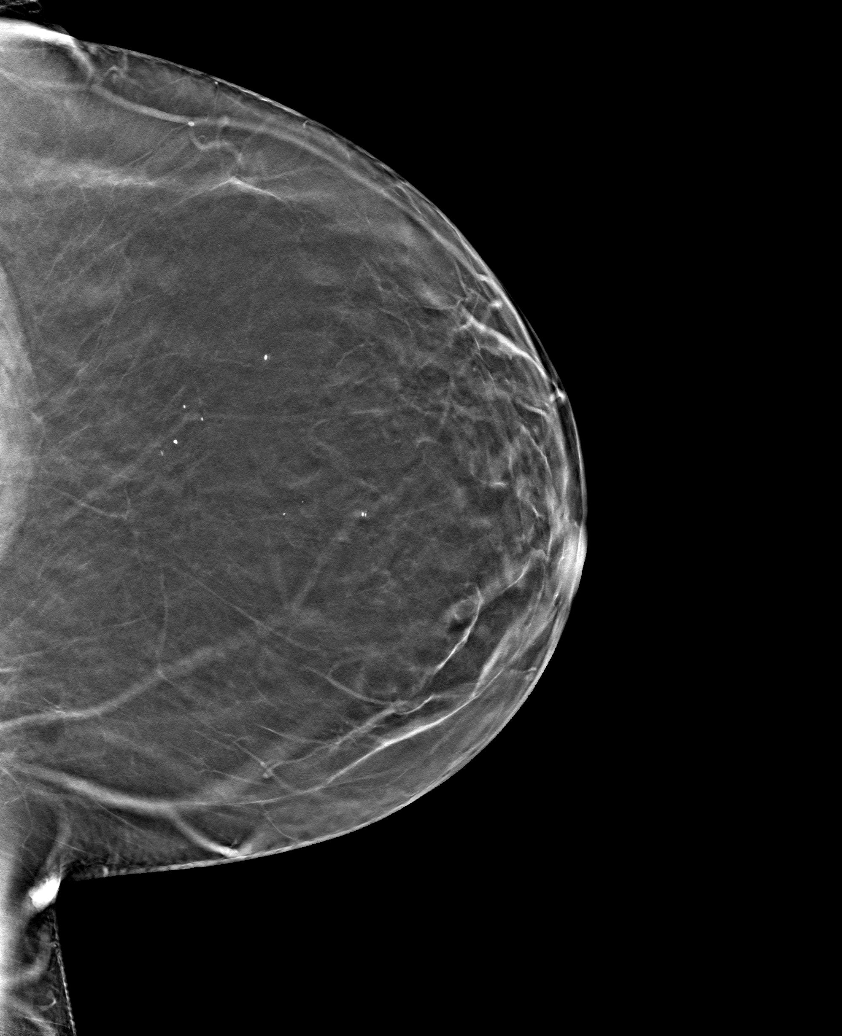

[6 of 30 positions shown; findings below may reference images not displayed]

FINDINGS: There are no findings suspicious for malignancy. Images were
processed with CAD.
IMPRESSION: No mammographic evidence of malignancy. A result letter of this
screening mammogram will be mailed directly to the patient.

RECOMMENDATION:
Screening mammogram in one year. (Code:8Y-Q-VVS)

BI-RADS CATEGORY  1: Negative.

## 2019-09-28 ENCOUNTER — Other Ambulatory Visit: Payer: Self-pay | Admitting: Family Medicine

## 2019-09-28 DIAGNOSIS — Z1231 Encounter for screening mammogram for malignant neoplasm of breast: Secondary | ICD-10-CM

## 2019-10-12 ENCOUNTER — Ambulatory Visit
Admission: RE | Admit: 2019-10-12 | Discharge: 2019-10-12 | Disposition: A | Discharge status: Home / Self Care | Payer: Commercial Managed Care - PPO | Source: Ambulatory Visit | Attending: Family Medicine | Admitting: Family Medicine

## 2019-10-12 ENCOUNTER — Other Ambulatory Visit: Payer: Self-pay

## 2019-10-12 DIAGNOSIS — Z1231 Encounter for screening mammogram for malignant neoplasm of breast: Secondary | ICD-10-CM

## 2019-10-13 ENCOUNTER — Other Ambulatory Visit: Payer: Self-pay | Admitting: Family Medicine

## 2019-10-13 ENCOUNTER — Other Ambulatory Visit (HOSPITAL_COMMUNITY)
Admission: RE | Admit: 2019-10-13 | Discharge: 2019-10-13 | Disposition: A | Discharge status: Home / Self Care | Payer: Commercial Managed Care - PPO | Source: Ambulatory Visit | Attending: Family Medicine | Admitting: Family Medicine

## 2019-10-13 DIAGNOSIS — Z124 Encounter for screening for malignant neoplasm of cervix: Secondary | ICD-10-CM | POA: Diagnosis not present

## 2019-10-14 LAB — CYTOLOGY - PAP
Comment: NEGATIVE
Diagnosis: NEGATIVE
High risk HPV: NEGATIVE

## 2020-02-10 ENCOUNTER — Other Ambulatory Visit: Payer: Self-pay

## 2020-02-10 ENCOUNTER — Encounter (HOSPITAL_COMMUNITY): Payer: Self-pay | Admitting: Orthopedic Surgery

## 2020-02-12 MED ORDER — DEXTROSE 5 % IV SOLN
3.0000 g | INTRAVENOUS | Status: AC
  Administered 2020-02-13: 3 g via INTRAVENOUS
  Filled 2020-02-12: qty 3.33

## 2020-02-12 NOTE — H&P (Signed)
TOTAL KNEE REVISION ADMISSION H&P  Patient is being admitted for right UKR poly revision  Subjective:  Chief Complaint: Right knee pain s/p UKR  HPI: Kim Brock, 60 y.o. female, who is 8.5 months out from right partial knee arthroplasty. She states she was getting out of her car on 02/08/2020 and felt her knee shift. She had instant pain and difficulty WB. Pain in posterior knee and swelling. She is walking with a walker, but still limited in movement. She tried using a knee immobilizer, but this caused severe pain in the posterior aspect of the knee. The only way she has been able to ambulate at all is with the support of a walker and at that she is significantly limiting her weight on the right leg due to pain.  X-rays were taken in the clinic reveal no acute bony abnormalities or fractures, but do show a dislocation posteriorly of the polyethylene.  The indications for the revision of the total knee arthroplasty are dislocation of the poly. Onset of symptoms was abrupt starting days years ago  Prior procedures on the right knee include unicompartmental arthroplasty.  Patient currently rates pain in the right knee at 9 out of 10 with activity. There is night pain, worsening of pain with activity and weight bearing, pain that interferes with activities of daily living, pain with passive range of motion and joint swelling.  This condition presents safety issues increasing the risk of falls.  There is no current active infection.  Risks, benefits and expectations were discussed with the patient.  Risks including but not limited to the risk of anesthesia, blood clots, nerve damage, blood vessel damage, failure of the prosthesis, infection and up to and including death.  Patient understand the risks, benefits and expectations and wishes to proceed with surgery.    Patient Active Problem List   Diagnosis Date Noted  . 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 S/P left UKR 07/14/2016  . LAP-BAND surgery status APL   03/30/12 06/18/2012  . Morbid obesity with BMI of 50.0-59.9, adult (HCC) 12/04/2011  . HTN (hypertension) 12/04/2011  . OSA on CPAP 12/04/2011  . Diabetes mellitus (HCC) 12/04/2011   Past Medical History:  Diagnosis Date  . Allergic rhinitis   . Anxiety   . Arthritis    oa both knees  . Asthma   . Bronchospasm    With URI  . Depression   . Depression with anxiety   . Diabetes mellitus without complication (HCC)    diet controlled  . 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 Pupil looks dilated always  . Hypertension   . Morbid obesity with BMI of 50.0-59.9, adult (HCC)    BMI 59.3  . Morbid obesity with BMI of 50.0-59.9, adult (HCC)   . Near sighted    Wears contacts  . OSA on CPAP    on auto CPAP for life  . PONV (postoperative nausea and vomiting)    nausea likes zofran, pt has motion sickness  . Vitamin D deficiency     Past Surgical History:  Procedure Laterality Date  . CHOLECYSTECTOMY  1999  . DILATION AND CURETTAGE OF UTERUS  2011  . KNEE SURGERY  2009/2010   left and right  knees arthroscopic meniscal repair  . LAPAROSCOPIC GASTRIC BANDING N/A 03/30/2012   Procedure: LAPAROSCOPIC GASTRIC BANDING;  Surgeon: Atilano Ina, MD,FACS;  Location: [facility];  Service: General;  Laterality: N/A;  Laparoscopic Adjustable Gastric  Band Placement,   . MESH APPLIED TO LAP PORT N/A 03/30/2012   Procedure: MESH APPLIED TO LAP PORT;  Surgeon: Atilano Ina, MD,FACS;  Location: [facility];  Service: General;  Laterality: N/A;  . PARTIAL KNEE ARTHROPLASTY Left 07/14/2016   Procedure: UNICOMPARTMENTAL LEFT KNEE;  Surgeon: Durene Romans, MD;  Location: [facility];  Service: Orthopedics;  Laterality: Left;  . PARTIAL KNEE ARTHROPLASTY Right 05/26/2019   Procedure: UNICOMPARTMENTAL KNEE MEDIALLY;  Surgeon: Durene Romans, MD;  Location: [facility];  Service: Orthopedics;  Laterality: Right;  70 mins  . TUBAL LIGATION  2001  . uterine ablation     5 years ago    Current  Facility-Administered Medications  Medication Dose Route Frequency Provider Last Rate Last Admin  . [START ON 02/13/2020] ceFAZolin (ANCEF) 3 g in dextrose 5 % 50 mL IVPB  3 g Intravenous On Call to OR Durene Romans, MD       Current Outpatient Medications  Medication Sig Dispense Refill Last Dose  . albuterol (PROVENTIL HFA;VENTOLIN HFA) 108 (90 BASE) MCG/ACT inhaler Inhale 2 puffs into the lungs every 4 (four) hours as needed for shortness of breath.      Marland Kitchen amLODipine (NORVASC) 10 MG tablet Take 10 mg by mouth daily.     Marland Kitchen atorvastatin (LIPITOR) 10 MG tablet Take 10 mg by mouth daily.     . clonazePAM (KLONOPIN) 0.5 MG tablet Take 0.5 mg by mouth at bedtime.     . diclofenac (VOLTAREN) 75 MG EC tablet Take 75 mg by mouth 2 (two) times daily.     Marland Kitchen escitalopram (LEXAPRO) 10 MG tablet Take 30 mg by mouth daily.      Marland Kitchen FLOVENT DISKUS 100 MCG/BLIST AEPB Inhale 1 puff into the lungs 2 (two) times daily.     Marland Kitchen HYDROcodone-acetaminophen (NORCO) 5-325 MG tablet Take 1-2 tablets by mouth every 4 (four) hours as needed for moderate pain or severe pain. 60 tablet 0   . lamoTRIgine (LAMICTAL) 100 MG tablet Take 100 mg by mouth daily.     Marland Kitchen latanoprost (XALATAN) 0.005 % ophthalmic solution Place 1 drop into the left eye at bedtime.     Marland Kitchen lisinopril-hydrochlorothiazide (PRINZIDE,ZESTORETIC) 20-25 MG tablet Take 1 tablet by mouth daily.     Marland Kitchen loratadine (CLARITIN) 10 MG tablet Take 10 mg by mouth daily as needed for allergies.     . methocarbamol (ROBAXIN) 500 MG tablet Take 1 tablet (500 mg total) by mouth every 6 (six) hours as needed for muscle spasms. 40 tablet 0   . predniSONE (DELTASONE) 20 MG tablet Take 20 mg by mouth 2 (two) times daily.      Allergies  Allergen Reactions  . Chlorhexidine Dermatitis and Rash  . Tape Other (See Comments) and Rash    Steri strips - blisters  . Demerol [Meperidine] Other (See Comments)    Blood pressure drops and makes me violently nauseous.  Marland Kitchen Epinephrine Other  (See Comments)    In Novocaine - makes jittery and heart race  . Percocet [Oxycodone-Acetaminophen]     Hallucinate, Family h/o of poor tolerance.  Will not take - can tolerate Vicodin  . Keflex [Cephalexin] Rash    Social History   Tobacco Use  . Smoking status: Former Smoker    Packs/day: 1.00    Years: 11.00    Pack years: 11.00    Types: Cigarettes    Quit date: 08/06/1991    Years since quitting: 28.5  . Smokeless tobacco: Never  Used  Substance Use Topics  . Alcohol use: Yes    Alcohol/week: 1.0 - 2.0 standard drink    Types: 1 - 2 Glasses of wine per week    Comment: occas    Family History  Problem Relation Age of Onset  . Diabetes Mother   . Hypertension Mother   . Osteoarthritis Mother   . Breast cancer Mother 66  . Hyperlipidemia Father   . Cancer Paternal Grandmother        Colon       Review of Systems  Constitutional: Negative.   HENT: Negative.   Eyes: Negative.   Respiratory: Negative.   Cardiovascular: Negative.   Gastrointestinal: Negative.   Genitourinary: Negative.   Musculoskeletal: Positive for joint pain.  Skin: Negative.   Neurological: Negative.   Endo/Heme/Allergies: Positive for environmental allergies.  Psychiatric/Behavioral: Positive for depression. The patient is nervous/anxious.        Objective:  Physical Exam Constitutional:      Appearance: She is well-developed.  HENT:     Head: Normocephalic.  Eyes:     Pupils: Pupils are equal, round, and reactive to light.  Neck:     Thyroid: No thyromegaly.     Vascular: No JVD.     Trachea: No tracheal deviation.  Cardiovascular:     Rate and Rhythm: Normal rate and regular rhythm.     Pulses: Intact distal pulses.  Pulmonary:     Effort: Pulmonary effort is normal. No respiratory distress.     Breath sounds: Normal breath sounds. No wheezing.  Abdominal:     Palpations: Abdomen is soft.     Tenderness: There is no abdominal tenderness. There is no guarding.   Musculoskeletal:     Cervical back: Neck supple.     Right knee: Swelling and bony tenderness present. No erythema or ecchymosis. Decreased range of motion. Tenderness present.  Lymphadenopathy:     Cervical: No cervical adenopathy.  Skin:    General: Skin is warm and dry.  Neurological:     Mental Status: She is alert and oriented to person, place, and time.  Psychiatric:        Mood and Affect: Mood and affect normal.       Labs:  Estimated body mass index is 52.35 kg/m as calculated from the following:   Height as of 08/23/19: 5\' 4"  (1.626 m).   Weight as of 08/23/19: 138.3 kg.  Imaging Review Plain radiographs demonstrate dislocation of the right knee medial UKR poly.  The bone quality appears to be good for age and reported activity level.     Assessment/Plan:  Dislocation of the right UKR poly  The patient history, physical examination, clinical judgment of the provider and imaging studies are consistent with dislocation of the right medial UKR poly. Revision of the poly in the UKR is deemed medically necessary. The treatment options including medical management, injection therapy, arthroscopy and revision arthroplasty were discussed at length. The risks and benefits of revision total knee arthroplasty were presented and reviewed. The risks due to aseptic loosening, infection, stiffness, patella tracking problems, thromboembolic complications and other imponderables were discussed. The patient acknowledged the explanation, agreed to proceed with the plan and consent was signed. Patient is being admitted for treatment for surgery, pain control, PT, OT, prophylactic antibiotics, VTE prophylaxis, progressive ambulation and ADL's and discharge planning.The patient is planning to be discharged home   08/25/19. Charlsie Fleeger   PA-C  02/12/2020, 3:02 PM

## 2020-02-13 ENCOUNTER — Other Ambulatory Visit: Payer: Self-pay

## 2020-02-13 ENCOUNTER — Encounter (HOSPITAL_COMMUNITY)
Admission: RE | Disposition: A | Discharge status: Home / Self Care | Payer: Self-pay | Source: Ambulatory Visit | Attending: Orthopedic Surgery

## 2020-02-13 ENCOUNTER — Encounter (HOSPITAL_COMMUNITY): Payer: Self-pay | Admitting: Orthopedic Surgery

## 2020-02-13 ENCOUNTER — Ambulatory Visit (HOSPITAL_COMMUNITY): Payer: Commercial Managed Care - PPO | Admitting: Anesthesiology

## 2020-02-13 ENCOUNTER — Ambulatory Visit (HOSPITAL_COMMUNITY)
Admission: RE | Admit: 2020-02-13 | Discharge: 2020-02-13 | Disposition: A | Discharge status: Home / Self Care | Payer: Commercial Managed Care - PPO | Source: Ambulatory Visit | Attending: Orthopedic Surgery | Admitting: Orthopedic Surgery

## 2020-02-13 DIAGNOSIS — Y792 Prosthetic and other implants, materials and accessory orthopedic devices associated with adverse incidents: Secondary | ICD-10-CM | POA: Insufficient documentation

## 2020-02-13 DIAGNOSIS — Z791 Long term (current) use of non-steroidal anti-inflammatories (NSAID): Secondary | ICD-10-CM | POA: Diagnosis not present

## 2020-02-13 DIAGNOSIS — Z888 Allergy status to other drugs, medicaments and biological substances status: Secondary | ICD-10-CM | POA: Insufficient documentation

## 2020-02-13 DIAGNOSIS — Z79899 Other long term (current) drug therapy: Secondary | ICD-10-CM | POA: Diagnosis not present

## 2020-02-13 DIAGNOSIS — X58XXXA Exposure to other specified factors, initial encounter: Secondary | ICD-10-CM | POA: Insufficient documentation

## 2020-02-13 DIAGNOSIS — Z881 Allergy status to other antibiotic agents status: Secondary | ICD-10-CM | POA: Insufficient documentation

## 2020-02-13 DIAGNOSIS — T84022A Instability of internal right knee prosthesis, initial encounter: Secondary | ICD-10-CM | POA: Diagnosis not present

## 2020-02-13 DIAGNOSIS — Z7952 Long term (current) use of systemic steroids: Secondary | ICD-10-CM | POA: Diagnosis not present

## 2020-02-13 DIAGNOSIS — Z96651 Presence of right artificial knee joint: Secondary | ICD-10-CM | POA: Diagnosis not present

## 2020-02-13 DIAGNOSIS — Z885 Allergy status to narcotic agent status: Secondary | ICD-10-CM | POA: Insufficient documentation

## 2020-02-13 DIAGNOSIS — Z7951 Long term (current) use of inhaled steroids: Secondary | ICD-10-CM | POA: Insufficient documentation

## 2020-02-13 DIAGNOSIS — Z87891 Personal history of nicotine dependence: Secondary | ICD-10-CM | POA: Diagnosis not present

## 2020-02-13 HISTORY — PX: TOTAL KNEE REVISION WITH SCAR DEBRIDEMENT/PATELLA REVISION WITH POLY EXCHANGE: SHX6128

## 2020-02-13 LAB — CBC
HCT: 44.3 % (ref 36.0–46.0)
Hemoglobin: 14.7 g/dL (ref 12.0–15.0)
MCH: 30.7 pg (ref 26.0–34.0)
MCHC: 33.2 g/dL (ref 30.0–36.0)
MCV: 92.5 fL (ref 80.0–100.0)
Platelets: 351 10*3/uL (ref 150–400)
RBC: 4.79 MIL/uL (ref 3.87–5.11)
RDW: 13.4 % (ref 11.5–15.5)
WBC: 10.2 10*3/uL (ref 4.0–10.5)
nRBC: 0 % (ref 0.0–0.2)

## 2020-02-13 LAB — BASIC METABOLIC PANEL
Anion gap: 16 — ABNORMAL HIGH (ref 5–15)
BUN: 13 mg/dL (ref 6–20)
CO2: 20 mmol/L — ABNORMAL LOW (ref 22–32)
Calcium: 9.5 mg/dL (ref 8.9–10.3)
Chloride: 102 mmol/L (ref 98–111)
Creatinine, Ser: 0.7 mg/dL (ref 0.44–1.00)
GFR, Estimated: >60 mL/min (ref >60)
Glucose, Bld: 131 mg/dL — ABNORMAL HIGH (ref 70–99)
Potassium: 3.6 mmol/L (ref 3.5–5.1)
Sodium: 138 mmol/L (ref 135–145)

## 2020-02-13 LAB — GLUCOSE, CAPILLARY
Glucose-Capillary: 128 mg/dL — ABNORMAL HIGH (ref 70–99)
Glucose-Capillary: 95 mg/dL (ref 70–99)

## 2020-02-13 LAB — TYPE AND SCREEN
ABO/RH(D): O POS
Antibody Screen: NEGATIVE

## 2020-02-13 SURGERY — TOTAL KNEE REVISION WITH SCAR DEBRIDEMENT/PATELLA REVISION WITH POLY EXCHANGE
Anesthesia: Monitor Anesthesia Care | Site: Knee | Laterality: Right

## 2020-02-13 MED ORDER — ASPIRIN 81 MG PO CHEW: 81.0000 mg | CHEWABLE_TABLET | Freq: Two times a day (BID) | ORAL | 0 refills | Status: AC

## 2020-02-13 MED ORDER — TRANEXAMIC ACID-NACL 1000-0.7 MG/100ML-% IV SOLN
1000.0000 mg | INTRAVENOUS | Status: AC
  Administered 2020-02-13: 1000 mg via INTRAVENOUS
  Filled 2020-02-13: qty 100

## 2020-02-13 MED ORDER — MEPIVACAINE HCL (PF) 2 % IJ SOLN
INTRAMUSCULAR | Status: AC
  Filled 2020-02-13: qty 20

## 2020-02-13 MED ORDER — LACTATED RINGERS IV BOLUS
500.0000 mL | Freq: Once | INTRAVENOUS | Status: AC
  Administered 2020-02-13: 500 mL via INTRAVENOUS

## 2020-02-13 MED ORDER — POLYETHYLENE GLYCOL 3350 17 G PO PACK: 17.0000 g | PACK | Freq: Two times a day (BID) | ORAL | 0 refills | Status: DC

## 2020-02-13 MED ORDER — HYDROCODONE-ACETAMINOPHEN 5-325 MG PO TABS
ORAL_TABLET | ORAL | Status: AC
  Filled 2020-02-13: qty 2

## 2020-02-13 MED ORDER — EPHEDRINE 5 MG/ML INJ
INTRAVENOUS | Status: AC
  Filled 2020-02-13: qty 10

## 2020-02-13 MED ORDER — HYDROCODONE-ACETAMINOPHEN 7.5-325 MG PO TABS: 1.0000 | ORAL_TABLET | ORAL | Status: DC | PRN

## 2020-02-13 MED ORDER — LACTATED RINGERS IV BOLUS
250.0000 mL | Freq: Once | INTRAVENOUS | Status: AC
  Administered 2020-02-13: 250 mL via INTRAVENOUS

## 2020-02-13 MED ORDER — HYDROMORPHONE HCL 1 MG/ML IJ SOLN
0.2500 mg | INTRAMUSCULAR | Status: DC | PRN
  Administered 2020-02-13 (×2): 0.5 mg via INTRAVENOUS

## 2020-02-13 MED ORDER — LACTATED RINGERS IV SOLN: INTRAVENOUS | Status: DC | PRN

## 2020-02-13 MED ORDER — 0.9 % SODIUM CHLORIDE (POUR BTL) OPTIME
TOPICAL | Status: DC | PRN
  Administered 2020-02-13: 1000 mL

## 2020-02-13 MED ORDER — KETAMINE HCL 10 MG/ML IJ SOLN
INTRAMUSCULAR | Status: AC
  Filled 2020-02-13: qty 1

## 2020-02-13 MED ORDER — STERILE WATER FOR IRRIGATION IR SOLN
Status: DC | PRN
  Administered 2020-02-13: 1000 mL

## 2020-02-13 MED ORDER — CEFAZOLIN SODIUM-DEXTROSE 2-4 GM/100ML-% IV SOLN: 2.0000 g | Freq: Four times a day (QID) | INTRAVENOUS | Status: DC

## 2020-02-13 MED ORDER — TRANEXAMIC ACID-NACL 1000-0.7 MG/100ML-% IV SOLN
INTRAVENOUS | Status: AC
  Filled 2020-02-13: qty 100

## 2020-02-13 MED ORDER — FENTANYL CITRATE (PF) 100 MCG/2ML IJ SOLN
INTRAMUSCULAR | Status: AC
  Filled 2020-02-13: qty 2

## 2020-02-13 MED ORDER — LIDOCAINE HCL (PF) 2 % IJ SOLN
INTRAMUSCULAR | Status: AC
  Filled 2020-02-13: qty 5

## 2020-02-13 MED ORDER — BUPIVACAINE-EPINEPHRINE (PF) 0.5% -1:200000 IJ SOLN
INTRAMUSCULAR | Status: DC | PRN
  Administered 2020-02-13: 15 mL via PERINEURAL

## 2020-02-13 MED ORDER — ACETAMINOPHEN 500 MG PO TABS
1000.0000 mg | ORAL_TABLET | Freq: Once | ORAL | Status: AC
  Administered 2020-02-13: 1000 mg via ORAL
  Filled 2020-02-13: qty 2

## 2020-02-13 MED ORDER — POVIDONE-IODINE 10 % EX SWAB
2.0000 "application " | Freq: Once | CUTANEOUS | Status: AC
  Administered 2020-02-13: 2 via TOPICAL

## 2020-02-13 MED ORDER — DOCUSATE SODIUM 100 MG PO CAPS: 100.0000 mg | ORAL_CAPSULE | Freq: Two times a day (BID) | ORAL | 0 refills | Status: AC

## 2020-02-13 MED ORDER — CELECOXIB 200 MG PO CAPS
200.0000 mg | ORAL_CAPSULE | Freq: Once | ORAL | Status: AC
  Administered 2020-02-13: 200 mg via ORAL
  Filled 2020-02-13: qty 1

## 2020-02-13 MED ORDER — PROPOFOL 1000 MG/100ML IV EMUL
INTRAVENOUS | Status: AC
  Filled 2020-02-13: qty 100

## 2020-02-13 MED ORDER — ONDANSETRON HCL 4 MG/2ML IJ SOLN
INTRAMUSCULAR | Status: DC | PRN
  Administered 2020-02-13: 4 mg via INTRAVENOUS

## 2020-02-13 MED ORDER — PROPOFOL 500 MG/50ML IV EMUL
INTRAVENOUS | Status: DC | PRN
  Administered 2020-02-13: 100 ug/kg/min via INTRAVENOUS
  Administered 2020-02-13: 40 mg via INTRAVENOUS

## 2020-02-13 MED ORDER — HYDROMORPHONE HCL 1 MG/ML IJ SOLN
INTRAMUSCULAR | Status: AC
  Filled 2020-02-13: qty 1

## 2020-02-13 MED ORDER — FENTANYL CITRATE (PF) 100 MCG/2ML IJ SOLN
INTRAMUSCULAR | Status: DC | PRN
  Administered 2020-02-13 (×2): 50 ug via INTRAVENOUS

## 2020-02-13 MED ORDER — HYDROCODONE-ACETAMINOPHEN 5-325 MG PO TABS
1.0000 | ORAL_TABLET | ORAL | Status: DC | PRN
  Administered 2020-02-13: 2 via ORAL

## 2020-02-13 MED ORDER — HYDROCODONE-ACETAMINOPHEN 7.5-325 MG PO TABS: 1.0000 | ORAL_TABLET | ORAL | 0 refills | Status: DC | PRN

## 2020-02-13 MED ORDER — FERROUS SULFATE 325 (65 FE) MG PO TABS: 325.0000 mg | ORAL_TABLET | Freq: Three times a day (TID) | ORAL | 0 refills | Status: DC

## 2020-02-13 MED ORDER — METHOCARBAMOL 500 MG IVPB - SIMPLE MED: 500.0000 mg | Freq: Four times a day (QID) | INTRAVENOUS | Status: DC | PRN

## 2020-02-13 MED ORDER — TRANEXAMIC ACID-NACL 1000-0.7 MG/100ML-% IV SOLN
1000.0000 mg | Freq: Once | INTRAVENOUS | Status: AC
  Administered 2020-02-13: 1000 mg via INTRAVENOUS

## 2020-02-13 MED ORDER — CEFAZOLIN SODIUM-DEXTROSE 2-4 GM/100ML-% IV SOLN
INTRAVENOUS | Status: AC
  Administered 2020-02-13: 2000 mg
  Filled 2020-02-13: qty 100

## 2020-02-13 MED ORDER — METHOCARBAMOL 500 MG PO TABS: 500.0000 mg | ORAL_TABLET | Freq: Four times a day (QID) | ORAL | Status: DC | PRN

## 2020-02-13 MED ORDER — SODIUM CHLORIDE 0.9 % IR SOLN
Status: DC | PRN
  Administered 2020-02-13: 1000 mL

## 2020-02-13 MED ORDER — METHOCARBAMOL 500 MG PO TABS: 500.0000 mg | ORAL_TABLET | Freq: Four times a day (QID) | ORAL | 0 refills | Status: DC | PRN

## 2020-02-13 MED ORDER — MIDAZOLAM HCL 2 MG/2ML IJ SOLN
1.0000 mg | INTRAMUSCULAR | Status: DC
  Filled 2020-02-13: qty 2

## 2020-02-13 MED ORDER — DEXAMETHASONE SODIUM PHOSPHATE 10 MG/ML IJ SOLN: 10.0000 mg | Freq: Once | INTRAMUSCULAR | Status: DC

## 2020-02-13 MED ORDER — PHENYLEPHRINE 40 MCG/ML (10ML) SYRINGE FOR IV PUSH (FOR BLOOD PRESSURE SUPPORT)
PREFILLED_SYRINGE | INTRAVENOUS | Status: AC
  Filled 2020-02-13: qty 10

## 2020-02-13 MED ORDER — MEPIVACAINE HCL (PF) 2 % IJ SOLN
INTRAMUSCULAR | Status: DC | PRN
  Administered 2020-02-13: 3 mL via INTRATHECAL

## 2020-02-13 MED ORDER — MIDAZOLAM HCL 2 MG/2ML IJ SOLN
INTRAMUSCULAR | Status: AC
  Filled 2020-02-13: qty 2

## 2020-02-13 MED ORDER — MIDAZOLAM HCL 2 MG/2ML IJ SOLN
INTRAMUSCULAR | Status: DC | PRN
  Administered 2020-02-13: 2 mg via INTRAVENOUS

## 2020-02-13 MED ORDER — KETAMINE HCL 10 MG/ML IJ SOLN
INTRAMUSCULAR | Status: DC | PRN
  Administered 2020-02-13 (×2): 30 mg via INTRAVENOUS

## 2020-02-13 MED ORDER — FENTANYL CITRATE (PF) 100 MCG/2ML IJ SOLN
50.0000 ug | INTRAMUSCULAR | Status: DC
  Filled 2020-02-13: qty 2

## 2020-02-13 SURGICAL SUPPLY — 65 items
ADH SKN CLS APL DERMABOND .7 (GAUZE/BANDAGES/DRESSINGS) ×1
BAG SPEC THK2 15X12 ZIP CLS (MISCELLANEOUS) ×1
BAG ZIPLOCK 12X15 (MISCELLANEOUS) ×2 IMPLANT
BANDAGE ESMARK 6X9 LF (GAUZE/BANDAGES/DRESSINGS) ×1 IMPLANT
BEARING MENISCAL TIBIAL 6 SM R (Orthopedic Implant) ×1 IMPLANT
BLADE SAW SGTL 11.0X1.19X90.0M (BLADE) IMPLANT
BLADE SAW SGTL 13.0X1.19X90.0M (BLADE) ×2 IMPLANT
BLADE SAW SGTL 81X20 HD (BLADE) ×2 IMPLANT
BNDG CMPR 9X6 STRL LF SNTH (GAUZE/BANDAGES/DRESSINGS) ×1
BNDG ELASTIC 6X5.8 VLCR STR LF (GAUZE/BANDAGES/DRESSINGS) ×2 IMPLANT
BNDG ESMARK 6X9 LF (GAUZE/BANDAGES/DRESSINGS) ×2
BOWL SMART MIX CTS (DISPOSABLE) IMPLANT
BRNG TIB SM 6 PHS 3 RT MEN (Orthopedic Implant) ×1 IMPLANT
COVER SURGICAL LIGHT HANDLE (MISCELLANEOUS) ×2 IMPLANT
COVER WAND RF STERILE (DRAPES) IMPLANT
CUFF TOURN SGL QUICK 34 (TOURNIQUET CUFF) ×2
CUFF TRNQT CYL 34X4.125X (TOURNIQUET CUFF) ×1 IMPLANT
DERMABOND ADVANCED (GAUZE/BANDAGES/DRESSINGS) ×1
DERMABOND ADVANCED .7 DNX12 (GAUZE/BANDAGES/DRESSINGS) ×1 IMPLANT
DRAPE POUCH INSTRU U-SHP 10X18 (DRAPES) ×2 IMPLANT
DRAPE SHEET LG 3/4 BI-LAMINATE (DRAPES) ×2 IMPLANT
DRAPE U-SHAPE 47X51 STRL (DRAPES) ×2 IMPLANT
DRESSING AQUACEL AG SP 3.5X10 (GAUZE/BANDAGES/DRESSINGS) IMPLANT
DRSG ADAPTIC 3X8 NADH LF (GAUZE/BANDAGES/DRESSINGS) ×2 IMPLANT
DRSG AQUACEL AG ADV 3.5X10 (GAUZE/BANDAGES/DRESSINGS) ×2 IMPLANT
DRSG AQUACEL AG SP 3.5X10 (GAUZE/BANDAGES/DRESSINGS)
DRSG PAD ABDOMINAL 8X10 ST (GAUZE/BANDAGES/DRESSINGS) ×2 IMPLANT
DURAPREP 26ML APPLICATOR (WOUND CARE) ×2 IMPLANT
ELECT REM PT RETURN 15FT ADLT (MISCELLANEOUS) ×2 IMPLANT
FACESHIELD WRAPAROUND (MASK) ×10 IMPLANT
FACESHIELD WRAPAROUND OR TEAM (MASK) ×5 IMPLANT
GAUZE SPONGE 4X4 12PLY STRL (GAUZE/BANDAGES/DRESSINGS) ×4 IMPLANT
GLOVE BIOGEL PI IND STRL 8.5 (GLOVE) ×2 IMPLANT
GLOVE BIOGEL PI INDICATOR 8.5 (GLOVE) ×2
GLOVE ECLIPSE 8.0 STRL XLNG CF (GLOVE) ×2 IMPLANT
GLOVE ORTHO TXT STRL SZ7.5 (GLOVE) ×4 IMPLANT
GLOVE SURG UNDER POLY LF SZ7.5 (GLOVE) ×2 IMPLANT
GOWN STRL REUS W/TWL LRG LVL3 (GOWN DISPOSABLE) ×2 IMPLANT
GOWN STRL REUS W/TWL XL LVL3 (GOWN DISPOSABLE) ×4 IMPLANT
HANDPIECE INTERPULSE COAX TIP (DISPOSABLE) ×2
IMMOBILIZER KNEE 20 (SOFTGOODS)
IMMOBILIZER KNEE 20 THIGH 36 (SOFTGOODS) IMPLANT
KIT TURNOVER KIT A (KITS) ×1 IMPLANT
MANIFOLD NEPTUNE II (INSTRUMENTS) ×2 IMPLANT
NDL SAFETY ECLIPSE 18X1.5 (NEEDLE) ×2 IMPLANT
NEEDLE HYPO 18GX1.5 SHARP (NEEDLE) ×4
NS IRRIG 1000ML POUR BTL (IV SOLUTION) ×2 IMPLANT
PADDING CAST COTTON 6X4 STRL (CAST SUPPLIES) ×4 IMPLANT
PENCIL SMOKE EVACUATOR (MISCELLANEOUS) IMPLANT
PROTECTOR NERVE ULNAR (MISCELLANEOUS) ×2 IMPLANT
SET HNDPC FAN SPRY TIP SCT (DISPOSABLE) ×1 IMPLANT
SET PAD KNEE POSITIONER (MISCELLANEOUS) ×2 IMPLANT
SPONGE LAP 18X18 RF (DISPOSABLE) ×2 IMPLANT
STAPLER VISISTAT 35W (STAPLE) IMPLANT
SUCTION FRAZIER HANDLE 12FR (TUBING) ×2
SUCTION TUBE FRAZIER 12FR DISP (TUBING) ×1 IMPLANT
SUT VIC AB 1 CT1 36 (SUTURE) ×6 IMPLANT
SUT VIC AB 2-0 CT1 27 (SUTURE) ×6
SUT VIC AB 2-0 CT1 TAPERPNT 27 (SUTURE) ×3 IMPLANT
SYR 50ML LL SCALE MARK (SYRINGE) ×2 IMPLANT
TOWEL OR 17X26 10 PK STRL BLUE (TOWEL DISPOSABLE) ×4 IMPLANT
TOWER CARTRIDGE SMART MIX (DISPOSABLE) ×2 IMPLANT
TRAY FOLEY MTR SLVR 16FR STAT (SET/KITS/TRAYS/PACK) ×2 IMPLANT
WATER STERILE IRR 1000ML POUR (IV SOLUTION) ×2 IMPLANT
WRAP KNEE MAXI GEL POST OP (GAUZE/BANDAGES/DRESSINGS) ×2 IMPLANT

## 2020-02-13 NOTE — Anesthesia Preprocedure Evaluation (Addendum)
Anesthesia Evaluation  Patient identified by MRN, date of birth, ID band Patient awake    Reviewed: Allergy & Precautions, H&P , NPO status , Patient's Chart, lab work & pertinent test results  History of Anesthesia Complications (+) PONV  Airway Mallampati: II  TM Distance: >3 FB Neck ROM: Full    Dental no notable dental hx. (+) Teeth Intact, Dental Advisory Given   Pulmonary asthma , sleep apnea and Continuous Positive Airway Pressure Ventilation , former smoker,    Pulmonary exam normal breath sounds clear to auscultation       Cardiovascular hypertension, Pt. on medications  Rhythm:Regular Rate:Normal     Neuro/Psych Anxiety Depression negative neurological ROS     GI/Hepatic negative GI ROS, Neg liver ROS,   Endo/Other  diabetesMorbid obesity  Renal/GU negative Renal ROS  negative genitourinary   Musculoskeletal  (+) Arthritis , Osteoarthritis,    Abdominal   Peds  Hematology negative hematology ROS (+)   Anesthesia Other Findings   Reproductive/Obstetrics negative OB ROS                            Anesthesia Physical Anesthesia Plan  ASA: III  Anesthesia Plan: Spinal and MAC   Post-op Pain Management:  Regional for Post-op pain   Induction: Intravenous  PONV Risk Score and Plan: 4 or greater and Ondansetron, Dexamethasone, Propofol infusion, TIVA and Midazolam  Airway Management Planned: Simple Face Mask  Additional Equipment:   Intra-op Plan:   Post-operative Plan:   Informed Consent: I have reviewed the patients History and Physical, chart, labs and discussed the procedure including the risks, benefits and alternatives for the proposed anesthesia with the patient or authorized representative who has indicated his/her understanding and acceptance.     Dental advisory given  Plan Discussed with: CRNA  Anesthesia Plan Comments:         Anesthesia Quick  Evaluation

## 2020-02-13 NOTE — Brief Op Note (Signed)
02/13/2020  2:39 PM  PATIENT:  Kim Brock  60 y.o. 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   Delay start of Pharmacological VTE agent (>24hrs) due to surgical blood loss or risk of bleeding: no

## 2020-02-13 NOTE — Anesthesia Procedure Notes (Signed)
Spinal  Patient location during procedure: OR Start time: 02/13/2020 2:37 PM End time: 02/13/2020 2:44 PM Staffing Performed: anesthesiologist  Anesthesiologist: Gaynelle Adu, MD Preanesthetic Checklist Completed: patient identified, IV checked, risks and benefits discussed, surgical consent, monitors and equipment checked, pre-op evaluation and timeout performed Spinal Block Patient position: sitting Prep: DuraPrep Patient monitoring: cardiac monitor, continuous pulse ox and blood pressure Approach: midline Location: L3-4 Injection technique: single-shot Needle Needle type: Pencan  Needle gauge: 24 G Needle length: 9 cm Assessment Sensory level: T8 Events: paresthesia and L and R parasthesia with needle placement. No injection until free of paresthesia. Additional Notes Functioning IV was confirmed and monitors were applied. Sterile prep and drape, including hand hygiene and sterile gloves were used. The patient was positioned and the spine was prepped. The skin was anesthetized with lidocaine.  Free flow of clear CSF was obtained prior to injecting local anesthetic into the CSF.  The spinal needle aspirated freely following injection.  The needle was carefully withdrawn.  The patient tolerated the procedure well.

## 2020-02-13 NOTE — Op Note (Signed)
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  PROCEDURE:  Revision, single component right partial knee arthroplasty, changing her polyethylene from a size 4 to a size 6 insert to match the right small femur.  SURGEON:  Durene Romans, MD  ASSISTANT:  Lanney Gins, PA-C  Note that Mr. Carmon Sails was present for the entirety of the case from preoperative positioning, perioperative management of the operative extremity, general facilitation of the case and primary wound closure.  ANESTHESIA:  Regional plus spinal.  BLOOD LOSS:  200 mL.  TOURNIQUET:  Not utilized.  BLOOD GIVEN:  None.  DRAINS:  None.  COMPLICATIONS:  None apparent.  INDICATIONS FOR PROCEDURE:  The patient is a 60 year old female who is status post right partial knee arthroplasty.  She has been thrilled with her overall recovery, regaining her quality of life and functional ability.  She unfortunately last week was  seen in the office after she placed her foot outside of the car pivoted to try to get out and when she did, felt a pop.  She was seen in the office and radiographs revealed a posterior dislocation of her polyethylene.  The implications of the injury and  the dislocation were reviewed.  The need for surgical intervention was evident.  Surgery was scheduled.  Risks of recurrent instability of the polyethylene, risk of infection, DVT, all discussed and reviewed.  The postoperative course was reviewed.  PROCEDURE IN DETAIL:  The patient was brought to the operative theater.  Once adequate anesthesia, preoperative antibiotics, Ancef, 3 g administered, she was  positioned supine with a right thigh tourniquet placed.  The right lower extremity was then  prepped and draped in sterile fashion with the leg over the Oxford leg holder.  Unfortunately, when we went to put the tourniquet up, there was a different attachment.  I elected to go ahead and do this without a tourniquet.  Her old incision was  excised.  Soft tissue planes created, cauterizing superficial blood vessels, wound and adipose tissue.  I then made a median arthrotomy.  We encountered clear synovial fluid.  Following exposure, we identified the polyethylene in the posterior aspect of  the knee.  I was able to grab this with a clamp and remove it without difficulty.  Of note,  on examination, she had a palpable medial collateral ligament.  There was no evidence of any injury to this.  I did retrial at this point, changing from a size 4  to a 6.  With the 6 mm insert, I felt this provided stability of her knee.  I do not know the etiology of this, I am certain that the size 4 insert was not as loose as it was evident today.  Again, no evidence of any medial collateral ligament injury.   Given these findings in the trial when it was performed, we selected the size 6 right medial insert to match the small femur.  After irrigating the knee, I snapped this new insert into place with a sensation as if it was a primary component.  At this  point, the extensor mechanism was reapproximated using #1 Vicryl and #1 Stratafix suture.  The remainder of the wound was closed with 2-0 Vicryl and a running Monocryl  stitch.  The knee was then cleaned, dried and dressed sterilely using surgical glue  and Aquacel dressing.  Postoperatively, she will be transferred to the recovery room with the intention to try to go home this evening.  If she is unable to get up with physical therapy or any concern regarding pain, than she will spend the night and go home in the morning.   We will see her back in 2 weeks and discuss the  need for physical therapy at that time.  HN/NUANCE  D:02/13/2020 T:02/13/2020 JOB:[id number]

## 2020-02-13 NOTE — Transfer of Care (Signed)
Immediate Anesthesia Transfer of Care Note  Patient: Kim Brock  Procedure(s) Performed: Procedure(s) with comments: Right unicompartmental arthroplasty poly revision (Right) - 90 mins  Patient Location: PACU  Anesthesia Type:Spinal  Level of Consciousness: awake, alert  and oriented  Airway & Oxygen Therapy: Patient Spontanous Breathing  Post-op Assessment: Report given to RN and Post -op Vital signs reviewed and stable  Post vital signs: Reviewed and stable  Last Vitals:  Vitals:   02/13/20 1206  BP: (!) 144/71  Pulse: 71  Resp: 18  Temp: 37 C  SpO2: 100%    Complications: No apparent anesthesia complications

## 2020-02-13 NOTE — Progress Notes (Signed)
AssistedDr. Edmond Fitzgerald with right, ultrasound guided, adductor canal block. Side rails up, monitors on throughout procedure. See vital signs in flow sheet. Tolerated Procedure well.  

## 2020-02-13 NOTE — Evaluation (Signed)
Physical Therapy Evaluation Patient Details Name: Kim Brock MRN: [id number] DOB: [birth year] Today's Date: 02/13/2020   History of Present Illness  Patient is 60 y.o. female s/p Rt UKR (poly revision) on 02/13/20 with PMH significant for OSA, Obesisty, HTN, glaucoma, depression, DM, anxiety, OA, Lt UKA in 2018 and Rt UKA on 05/26/19, laproscopic gastric band in 2014.    Clinical Impression  Kim Brock is a 60 y.o. female POD 0 s/p Rt UKR. Patient reports independence with mobility at baseline. Patient is now limited by functional impairments (see PT problem list below) and requires min guard/supervision for transfers and gait with RW. Patient was able to ambulate ~120 feet with RW and min guard/supervision and cues for safe walker management. Patient educated on safe sequencing for stair mobility and verbalized safe guarding position for people assisting with mobility. Patient instructed in exercises to facilitate ROM and circulation. Patient will benefit from continued skilled PT interventions to address impairments and progress towards PLOF. Patient has met mobility goals at adequate level for discharge home; will continue to follow if pt continues acute stay to progress towards Mod I goals.     Follow Up Recommendations Follow surgeon's recommendation for DC plan and follow-up therapies;Outpatient PT    Equipment Recommendations  None recommended by PT    Recommendations for Other Services       Precautions / Restrictions Precautions Precautions: Fall Restrictions Weight Bearing Restrictions: No Other Position/Activity Restrictions: WBAT      Mobility  Bed Mobility Overal bed mobility: Needs Assistance Bed Mobility: Supine to Sit     Supine to sit: Supervision     General bed mobility comments: no assist required, supervision for safety.    Transfers Overall transfer level: Needs assistance Equipment used: Rolling walker (2 wheeled) Transfers: Sit to/from  Stand Sit to Stand: Min guard;Supervision         General transfer comment: cues for safety with RW, no assist needed for power up.  Ambulation/Gait Ambulation/Gait assistance: Supervision;Min guard Gait Distance (Feet): 120 Feet Assistive device: Rolling walker (2 wheeled) Gait Pattern/deviations: Step-to pattern;Decreased weight shift to right;Decreased stride length;Wide base of support Gait velocity: decr   General Gait Details: verbal cues for step pattern, no overt LOB noted and pt maintained safe proximity to RW. occasional cues needed to keep walker on ground.  Stairs Stairs: Yes Stairs assistance: Min assist;Min guard Stair Management: No rails;Step to pattern;Forwards;With walker Number of Stairs: 3 General stair comments: Verbal cues for safe sequenicng and RW placement. no overt LOB, assist to stabilize walker and pt verbalized safe understanding of family guarding.  Wheelchair Mobility    Modified Rankin (Stroke Patients Only)       Balance Overall balance assessment: Needs assistance Sitting-balance support: Feet supported Sitting balance-Leahy Scale: Good     Standing balance support: During functional activity;Bilateral upper extremity supported Standing balance-Leahy Scale: Fair                               Pertinent Vitals/Pain Pain Assessment: Faces Faces Pain Scale: Hurts little more Pain Location: Rt knee Pain Descriptors / Indicators: Aching;Discomfort;Moaning Pain Intervention(s): Limited activity within patient's tolerance;Monitored during session;Repositioned    Home Living Family/patient expects to be discharged to:: Private residence Living Arrangements: Alone Available Help at Discharge: Family Type of Home: House Home Access: Stairs to enter Entrance Stairs-Rails: None Entrance Stairs-Number of Steps: 3+1 Home Layout: One level Home Equipment: Environmental consultant - 4  wheels;Bedside commode;Shower seat Additional Comments: pt's  daughter will be here to help her with recovery    Prior Function Level of Independence: Independent         Comments: works full time as Therapist, sports at dialysis center     [employer]   Dominant Hand: Right    Extremity/Trunk Assessment   Upper Extremity Assessment Upper Extremity Assessment: Overall WFL for tasks assessed    Lower Extremity Assessment Lower Extremity Assessment: RLE deficits/detail RLE Deficits / Details: good quad activation, no extensor lag with SLR RLE Sensation: WNL RLE Coordination: WNL    Cervical / Trunk Assessment Cervical / Trunk Assessment: Normal;Other exceptions Cervical / Trunk Exceptions: body habitus  Communication   Communication: No difficulties  Cognition Arousal/Alertness: Awake/alert Behavior During Therapy: WFL for tasks assessed/performed Overall Cognitive Status: Within Functional Limits for tasks assessed                                        General Comments      Exercises Total Joint Exercises Ankle Circles/Pumps: AROM;Both;20 reps;Seated Quad Sets: AROM;5 reps;Right;Seated Heel Slides: AROM;Right;Other reps (comment);Seated (1)   Assessment/Plan    PT Assessment Patient needs continued PT services  PT Problem List Decreased strength;Decreased range of motion;Decreased activity tolerance;Decreased balance;Decreased mobility;Decreased knowledge of use of DME;Decreased knowledge of precautions;Obesity;Pain       PT Treatment Interventions DME instruction;Gait training;Stair training;Functional mobility training;Therapeutic activities;Therapeutic exercise;Balance training;Patient/family education    PT Goals (Current goals can be found in the Care Plan section)  Acute Rehab PT Goals Patient Stated Goal: ge thome tonight PT Goal Formulation: With patient Time For Goal Achievement: 02/20/20 Potential to Achieve Goals: Good    Frequency 7X/week   Barriers to discharge        Co-evaluation                AM-PAC PT "6 Clicks" Mobility  Outcome Measure Help needed turning from your back to your side while in a flat bed without using bedrails?: None Help needed moving from lying on your back to sitting on the side of a flat bed without using bedrails?: A Little Help needed moving to and from a bed to a chair (including a wheelchair)?: A Little Help needed standing up from a chair using your arms (e.g., wheelchair or bedside chair)?: A Little Help needed to walk in hospital room?: A Little Help needed climbing 3-5 steps with a railing? : A Little 6 Click Score: 19    End of Session Equipment Utilized During Treatment: Gait belt Activity Tolerance: Patient tolerated treatment well Patient left: in chair;with call bell/phone within reach Nurse Communication: Mobility status PT Visit Diagnosis: Muscle weakness (generalized) (M62.81);Difficulty in walking, not elsewhere classified (R26.2)    Time: 9024-0973 PT Time Calculation (min) (ACUTE ONLY): 40 min   Charges:   PT Evaluation $PT Eval Low Complexity: 1 Low PT Treatments $Gait Training: 8-22 mins $Therapeutic Exercise: 8-22 mins        Verner Mould, DPT Acute Rehabilitation Services Office [phone] Pager [phone]    Jacques Navy 02/13/2020, 6:54 PM

## 2020-02-13 NOTE — Anesthesia Procedure Notes (Signed)
Anesthesia Regional Block: Adductor canal block   Pre-Anesthetic Checklist: ,, timeout performed, Correct Patient, Correct Site, Correct Laterality, Correct Procedure, Correct Position, site marked, Risks and benefits discussed, pre-op evaluation,  At surgeon's request and post-op pain management  Laterality: Right  Prep: Maximum Sterile Barrier Precautions used, chloraprep       Needles:  Injection technique: Single-shot  Needle Type: Echogenic Stimulator Needle     Needle Length: 9cm  Needle Gauge: 21     Additional Needles:   Procedures:,,,, ultrasound used (permanent image in chart),,,,  Narrative:  Start time: 02/13/2020 1:43 PM End time: 02/13/2020 1:33 PM Injection made incrementally with aspirations every 5 mL.  Performed by: Personally  Anesthesiologist: Gaynelle Adu, MD  Additional Notes: 2% Lidocaine skin wheel.

## 2020-02-13 NOTE — Anesthesia Postprocedure Evaluation (Signed)
Anesthesia Post Note  Patient: Kim Brock  Procedure(s) Performed: Right unicompartmental arthroplasty poly revision (Right Knee)     Patient location during evaluation: PACU Anesthesia Type: MAC, Spinal and Regional Level of consciousness: oriented and awake and alert Pain management: pain level controlled Vital Signs Assessment: post-procedure vital signs reviewed and stable Respiratory status: spontaneous breathing, respiratory function stable and patient connected to nasal cannula oxygen Cardiovascular status: blood pressure returned to baseline and stable Postop Assessment: no headache, no backache, no apparent nausea or vomiting, spinal receding and patient able to bend at knees Anesthetic complications: no   No complications documented.  Last Vitals:  Vitals:   02/13/20 1645 02/13/20 1700  BP: (!) 116/46 119/60  Pulse: 74 62  Resp: (!) 22 10  Temp:    SpO2: 99% 98%    Last Pain:  Vitals:   02/13/20 1700  TempSrc:   PainSc: 7                  Yelina Sarratt,W. EDMOND

## 2020-02-14 ENCOUNTER — Encounter (HOSPITAL_COMMUNITY): Payer: Self-pay | Admitting: Orthopedic Surgery

## 2020-10-03 ENCOUNTER — Telehealth: Payer: Self-pay | Admitting: Cardiology

## 2020-10-03 ENCOUNTER — Other Ambulatory Visit: Payer: Self-pay | Admitting: Family Medicine

## 2020-10-03 DIAGNOSIS — Z1231 Encounter for screening mammogram for malignant neoplasm of breast: Secondary | ICD-10-CM

## 2020-10-03 DIAGNOSIS — Z9989 Dependence on other enabling machines and devices: Secondary | ICD-10-CM

## 2020-10-03 DIAGNOSIS — G4733 Obstructive sleep apnea (adult) (pediatric): Secondary | ICD-10-CM

## 2020-10-03 NOTE — Telephone Encounter (Signed)
Patient states she needs a new CPAP, because the one she has is old and is going to die.

## 2020-10-04 NOTE — Telephone Encounter (Signed)
Patient never got her new cpap last year because of personal problems and now her old cpap is dying. Patient is asking for a new Rx because the one in epic has expired. Patient has an appointment scheduled in November.

## 2020-10-12 NOTE — Telephone Encounter (Signed)
Order placed to [facility] via community message. Patient understands they will be called once confirmation has been received from [facility] that they have received their new machine to schedule 10 week follow up appointment.   [facility] notified of new cpap order  Please add to [facility]

## 2020-10-12 NOTE — Telephone Encounter (Signed)
Per dr Mayford Knife, order a new ResMed CPAP on auto from 4-20cm H2O and followup with me in 8 weeks

## 2020-11-01 ENCOUNTER — Encounter: Payer: Self-pay | Admitting: Cardiology

## 2020-11-02 ENCOUNTER — Encounter: Payer: Self-pay | Admitting: Cardiology

## 2020-11-02 ENCOUNTER — Ambulatory Visit (INDEPENDENT_AMBULATORY_CARE_PROVIDER_SITE_OTHER): Payer: Commercial Managed Care - PPO | Admitting: Cardiology

## 2020-11-02 ENCOUNTER — Other Ambulatory Visit: Payer: Self-pay

## 2020-11-02 VITALS — BP 116/62 | HR 81 | Ht 64.5 in | Wt 311.6 lb

## 2020-11-02 DIAGNOSIS — Z6841 Body Mass Index (BMI) 40.0 and over, adult: Secondary | ICD-10-CM

## 2020-11-02 DIAGNOSIS — I10 Essential (primary) hypertension: Secondary | ICD-10-CM

## 2020-11-02 DIAGNOSIS — G4733 Obstructive sleep apnea (adult) (pediatric): Secondary | ICD-10-CM | POA: Diagnosis not present

## 2020-11-02 DIAGNOSIS — Z9989 Dependence on other enabling machines and devices: Secondary | ICD-10-CM

## 2020-11-02 NOTE — Patient Instructions (Signed)

## 2020-11-02 NOTE — Progress Notes (Signed)
Date:  11/02/2020   ID:  Kim Brock, DOB August 09, 1960, MRN [id number]   PCP:  Laurann Montana, MD  Sleep Medicine:  Armanda Magic, MD Electrophysiologist:  None   Chief Complaint:  OSA  History of Present Illness:    Kim Brock is a 60 y.o. female  with a hx of  OSA, HTN and morbid obesity.SHe is doing well with her CPAP device and thinks that she has gotten used to it.  She tolerates the nasal pillow mask and she feels the pressure is adequate.  Since going on CPAP she feels rested in the am and has no significant daytime sleepiness.  She denies any significant mouth or nasal dryness or nasal congestion.  She does not think that he snores.     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 Pupil looks dilated always   Hypertension    Morbid obesity with BMI of 50.0-59.9, adult (HCC)    BMI 59.3   Morbid obesity with BMI of 50.0-59.9, adult (HCC)    Near sighted    Wears contacts   OSA on CPAP    on auto CPAP for life   PONV (postoperative nausea and vomiting)    nausea likes zofran, pt has motion sickness   Vitamin D deficiency    Past Surgical History:  Procedure Laterality Date   CHOLECYSTECTOMY  1999   DILATION AND CURETTAGE OF UTERUS  2011   KNEE SURGERY  2009/2010   left and right  knees arthroscopic meniscal repair   LAPAROSCOPIC GASTRIC BANDING N/A 03/30/2012   Procedure: LAPAROSCOPIC GASTRIC BANDING;  Surgeon: Atilano Ina, MD,FACS;  Location: [facility];  Service: General;  Laterality: N/A;  Laparoscopic Adjustable Gastric Band Placement,    MESH APPLIED TO LAP PORT N/A 03/30/2012   Procedure:  MESH APPLIED TO LAP PORT;  Surgeon: Atilano Ina, MD,FACS;  Location: [facility];  Service: General;  Laterality: N/A;   PARTIAL KNEE ARTHROPLASTY Left 07/14/2016   Procedure: UNICOMPARTMENTAL LEFT KNEE;  Surgeon: Durene Romans, MD;  Location: [facility];  Service: Orthopedics;  Laterality: Left;   PARTIAL KNEE ARTHROPLASTY Right 05/26/2019   Procedure: UNICOMPARTMENTAL KNEE MEDIALLY;  Surgeon: Durene Romans, MD;  Location: [facility];  Service: Orthopedics;  Laterality: Right;  70 mins   TOTAL KNEE REVISION WITH SCAR DEBRIDEMENT/PATELLA REVISION WITH POLY EXCHANGE Right 02/13/2020   Procedure: Right unicompartmental arthroplasty poly revision;  Surgeon: Durene Romans, MD;  Location: [facility];  Service: Orthopedics;  Laterality: Right;  90 mins   TUBAL LIGATION  2001   uterine ablation     5 years ago     Current Meds  Medication Sig   albuterol (PROVENTIL HFA;VENTOLIN HFA) 108 (90 BASE) MCG/ACT inhaler Inhale 2 puffs into the lungs every 4 (four) hours as needed for shortness of breath.    amLODipine (NORVASC) 10 MG tablet Take 10 mg by mouth daily.   atorvastatin (LIPITOR) 10 MG tablet Take 10 mg by mouth daily.   clonazePAM (KLONOPIN) 0.5 MG  tablet Take 0.5 mg by mouth at bedtime.   escitalopram (LEXAPRO) 10 MG tablet Take 30 mg by mouth daily.    FLOVENT DISKUS 100 MCG/BLIST AEPB Inhale 1 puff into the lungs 2 (two) times daily.   HYDROcodone-acetaminophen (NORCO) 7.5-325 MG tablet Take 1-2 tablets by mouth every 4 (four) hours as needed for moderate pain.   lamoTRIgine (LAMICTAL) 100 MG tablet Take 100 mg by mouth daily.   lisinopril-hydrochlorothiazide (PRINZIDE,ZESTORETIC) 20-25 MG tablet Take 1 tablet by mouth daily.   loratadine (CLARITIN) 10 MG tablet Take 10 mg by mouth daily as needed for allergies.   methocarbamol (ROBAXIN) 500 MG tablet Take 1 tablet (500 mg total) by mouth every 6 (six) hours as needed for muscle spasms.     Allergies:   Chlorhexidine, Tape, Demerol [meperidine],  Epinephrine, Percocet [oxycodone-acetaminophen], and Keflex [cephalexin]   Social History   Tobacco Use   Smoking status: Former    Packs/day: 1.00    Years: 11.00    Pack years: 11.00    Types: Cigarettes    Quit date: 08/06/1991    Years since quitting: 29.2   Smokeless tobacco: Never  Vaping Use   Vaping Use: Never used  Substance Use Topics   Alcohol use: Yes    Alcohol/week: 1.0 - 2.0 standard drink    Types: 1 - 2 Glasses of wine per week    Comment: occas   Drug use: No     Family Hx: The patient's family history includes Breast cancer (age of onset: 18) in her mother; Cancer in her paternal grandmother; Diabetes in her mother; Hyperlipidemia in her father; Hypertension in her mother; Osteoarthritis in her mother.  ROS:   Please see the history of present illness.     All other systems reviewed and are negative.   Labs/Other Tests and Data Reviewed:    Recent Labs: 02/13/2020: BUN 13; Creatinine, Ser 0.70; Hemoglobin 14.7; Platelets 351; Potassium 3.6; Sodium 138   Recent Lipid Panel Lab Results  Component Value Date/Time   CHOL 164 01/06/2012 09:25 AM   TRIG 157 (H) 01/06/2012 09:25 AM   HDL 62 01/06/2012 09:25 AM   CHOLHDL 2.6 01/06/2012 09:25 AM   LDLCALC 71 01/06/2012 09:25 AM    Wt Readings from Last 3 Encounters:  11/02/20 (!) 311 lb 9.6 oz (141.3 kg)  02/13/20 297 lb 4.8 oz (134.9 kg)  08/23/19 (!) 305 lb (138.3 kg)     Objective:    Vital Signs:  BP 116/62   Pulse 81   Ht 5' 4.5" (1.638 m)   Wt (!) 311 lb 9.6 oz (141.3 kg) Comment: Patient has on a walking boot  SpO2 98%   BMI 52.66 kg/m    GEN: Well nourished, well developed in no acute distress HEENT: Normal NECK: No JVD; No carotid bruits LYMPHATICS: No lymphadenopathy CARDIAC:RRR, no murmurs, rubs, gallops RESPIRATORY:  Clear to auscultation without rales, wheezing or rhonchi  ABDOMEN: Soft, non-tender, non-distended MUSCULOSKELETAL:  No edema; No deformity  SKIN: Warm and  dry NEUROLOGIC:  Alert and oriented x 3 PSYCHIATRIC:  Normal affect    ASSESSMENT & PLAN:    1.  OSA - The patient is tolerating PAP therapy well without any problems. The PAP download performed by his DME was personally reviewed and interpreted by me today and showed an AHI of 5.5/hr on auto PAP with 100% compliance in using more than 4 hours nightly.  The patient has been using and benefiting from PAP use and will  continue to benefit from therapy.    2.  Hypertension  -BP is well controlled on exam today -continue prescription drug management with amlodipine 10mg  daily and Lisinopril HCT 20-25mg  daily with PRN refills  3.  Morbid Obesity  -encouraged her to work on her diet and exercise  Medication Adjustments/Labs and Tests Ordered: Current medicines are reviewed at length with the patient today.  Concerns regarding medicines are outlined above.  Tests Ordered: No orders of the defined types were placed in this encounter.  Medication Changes: No orders of the defined types were placed in this encounter.   Disposition:  Follow up in 1 year(s)  Signed, , MD  11/02/2020 2:28 PM    [facility]

## 2020-11-15 ENCOUNTER — Ambulatory Visit
Admission: RE | Admit: 2020-11-15 | Discharge: 2020-11-15 | Disposition: A | Discharge status: Home / Self Care | Payer: Commercial Managed Care - PPO | Source: Ambulatory Visit | Attending: Family Medicine | Admitting: Family Medicine

## 2020-11-15 ENCOUNTER — Other Ambulatory Visit: Payer: Self-pay

## 2020-11-15 DIAGNOSIS — Z1231 Encounter for screening mammogram for malignant neoplasm of breast: Secondary | ICD-10-CM

## 2020-11-22 ENCOUNTER — Telehealth: Payer: Self-pay | Admitting: *Deleted

## 2020-11-22 DIAGNOSIS — G4733 Obstructive sleep apnea (adult) (pediatric): Secondary | ICD-10-CM

## 2020-11-22 NOTE — Telephone Encounter (Signed)
-----   Message from Quintella Reichert, MD sent at 11/22/2020 11:08 AM EDT ----- Download shows good compliance and very mildly elevated AHI at 5.5/h.  It also shows a significant mask leak.  Please find out from patient what mask she is using and if she feels it is leaking

## 2020-11-22 NOTE — Telephone Encounter (Signed)
Patient does not feel her mask is leaking. She uses the nasal pillows. Patient will try to tighten up her mask and the pillows to see if that works.

## 2020-11-23 NOTE — Addendum Note (Signed)
Addended by: Reesa Chew on: 11/23/2020 09:14 AM   Modules accepted: Orders

## 2020-11-23 NOTE — Addendum Note (Signed)
Addended by: Reesa Chew on: 11/23/2020 01:54 PM   Modules accepted: Orders

## 2020-11-23 NOTE — Addendum Note (Signed)
Addended by: Reesa Chew on: 11/23/2020 02:25 PM   Modules accepted: Orders

## 2020-11-23 NOTE — Telephone Encounter (Signed)
Per dr Felicita Gage, MD  Reesa Chew, CMA If she is not using a chinstrap please add that    Order placed to [facility] for chin strap

## 2020-12-04 NOTE — Telephone Encounter (Signed)
Order placed and received for a new ResMed CPAP on auto from 4-20cm H2O with heated humidifier and followup with me in 8 weeks and all supplies. Order receipt confirmed by Karle Plumber at [facility].

## 2020-12-17 ENCOUNTER — Telehealth: Payer: Commercial Managed Care - PPO | Admitting: Cardiology

## 2021-03-19 ENCOUNTER — Encounter: Payer: Self-pay | Admitting: Cardiology

## 2021-03-19 ENCOUNTER — Other Ambulatory Visit: Payer: Self-pay

## 2021-03-19 ENCOUNTER — Telehealth (INDEPENDENT_AMBULATORY_CARE_PROVIDER_SITE_OTHER): Payer: Commercial Managed Care - PPO | Admitting: Cardiology

## 2021-03-19 VITALS — Ht 64.5 in | Wt 302.0 lb

## 2021-03-19 DIAGNOSIS — Z9989 Dependence on other enabling machines and devices: Secondary | ICD-10-CM | POA: Diagnosis not present

## 2021-03-19 DIAGNOSIS — G4733 Obstructive sleep apnea (adult) (pediatric): Secondary | ICD-10-CM

## 2021-03-19 DIAGNOSIS — I10 Essential (primary) hypertension: Secondary | ICD-10-CM | POA: Diagnosis not present

## 2021-03-19 NOTE — Patient Instructions (Signed)

## 2021-03-19 NOTE — Progress Notes (Signed)
Virtual Visit via Video Note   This visit type was conducted due to national recommendations for restrictions regarding the COVID-19 Pandemic (e.g. social distancing) in an effort to limit this patient's exposure and mitigate transmission in our community.  Due to her co-morbid illnesses, this patient is at least at moderate risk for complications without adequate follow up.  This format is felt to be most appropriate for this patient at this time.  All issues noted in this document were discussed and addressed.  A limited physical exam was performed with this format.  Please refer to the patient's chart for her consent to telehealth for [facility].   Date:  03/19/2021   ID:  Kim Brock, DOB 11/23/1960, MRN [id number] The patient was identified using 2 identifiers.  Patient Location: Home Provider Location: Home Office   PCP:  Harlan Stains, MD   [facility] Providers Cardiologist:  None Sleep Medicine:  Fransico Him, MD     Evaluation Performed:  Follow-Up Visit  Chief Complaint:  OSA  History of Present Illness:    Kim Brock is a 61 y.o. female with a hx of  OSA, HTN and morbid obesity.  She is doing well with her CPAP device and thinks that she has gotten used to it.  She tolerates the mask and feels the pressure is adequate.  Since going on CPAP feeling feels rested in the am and has no significant daytime sleepiness.  She denies any significant mouth or nasal dryness or nasal congestion.  She does not think that he snores.     The patient does not have symptoms concerning for COVID-19 infection (fever, chills, cough, or new shortness of breath).    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  Pupil looks dilated always   Hypertension    Morbid obesity with BMI of 50.0-59.9, adult ([place])    BMI 59.3   Near sighted    Wears contacts   OSA on CPAP    on auto CPAP for life   PONV (postoperative nausea and vomiting)    nausea likes zofran, pt has motion sickness   Vitamin D deficiency    Past Surgical History:  Procedure Laterality Date   [place] OF UTERUS  2011   KNEE SURGERY  2009/2010   left and right  knees arthroscopic meniscal repair   LAPAROSCOPIC GASTRIC BANDING N/A 03/30/2012   Procedure: LAPAROSCOPIC GASTRIC BANDING;  Surgeon: Gayland Curry, MD,FACS;  Location: [facility];  Service: General;  Laterality: N/A;  Laparoscopic Adjustable Gastric Band Placement,    MESH APPLIED TO LAP PORT N/A 03/30/2012   Procedure: MESH APPLIED TO LAP PORT;  Surgeon: Gayland Curry, MD,FACS;  Location: [facility];  Service: General;  Laterality: N/A;   PARTIAL KNEE ARTHROPLASTY Left 07/14/2016   Procedure: UNICOMPARTMENTAL LEFT KNEE;  Surgeon: Paralee Cancel, MD;  Location: [facility];  Service: Orthopedics;  Laterality: Left;   PARTIAL KNEE ARTHROPLASTY Right 05/26/2019   Procedure: UNICOMPARTMENTAL KNEE MEDIALLY;  Surgeon: Paralee Cancel, MD;  Location: [facility];  Service: Orthopedics;  Laterality: Right;  70  mins   TOTAL KNEE REVISION WITH SCAR DEBRIDEMENT/PATELLA REVISION WITH POLY EXCHANGE Right 02/13/2020   Procedure: Right unicompartmental arthroplasty poly revision;  Surgeon: Paralee Cancel, MD;  [facility]: [facility];  Service: Orthopedics;  Laterality: Right;  90 mins   TUBAL LIGATION  2001   uterine ablation     5 years ago     Current Meds  Medication Sig   albuterol (PROVENTIL HFA;VENTOLIN HFA) 108 (90 BASE) MCG/ACT inhaler Inhale 2 puffs into the lungs every 4 (four) hours as needed for shortness of breath.    amLODipine (NORVASC) 10 MG tablet Take 10 mg by mouth daily.   atorvastatin (LIPITOR) 10 MG tablet Take 10 mg by mouth daily.   clonazePAM (KLONOPIN)  0.5 MG tablet Take 0.5 mg by mouth at bedtime.   docusate sodium (COLACE) 100 MG capsule Take 1 capsule (100 mg total) by mouth 2 (two) times daily.   escitalopram (LEXAPRO) 10 MG tablet Take 30 mg by mouth daily.    FLOVENT DISKUS 100 MCG/BLIST AEPB Inhale 1 puff into the lungs 2 (two) times daily.   HYDROcodone-acetaminophen (NORCO) 7.5-325 MG tablet Take 1-2 tablets by mouth every 4 (four) hours as needed for moderate pain.   lamoTRIgine (LAMICTAL) 100 MG tablet Take 100 mg by mouth daily.   lisinopril-hydrochlorothiazide (PRINZIDE,ZESTORETIC) 20-25 MG tablet Take 1 tablet by mouth daily.   loratadine (CLARITIN) 10 MG tablet Take 10 mg by mouth daily as needed for allergies.   methocarbamol (ROBAXIN) 500 MG tablet Take 1 tablet (500 mg total) by mouth every 6 (six) hours as needed for muscle spasms.   Semaglutide (RYBELSUS) 3 MG TABS Take 3 mg by mouth daily.     Allergies:   Chlorhexidine, Tape, Demerol [meperidine], Epinephrine, Percocet [oxycodone-acetaminophen], and Keflex [cephalexin]   Social History   Tobacco Use   Smoking status: Former    Packs/day: 1.00    Years: 11.00    Pack years: 11.00    Types: Cigarettes    Quit date: 08/06/1991    Years since quitting: 29.6   Smokeless tobacco: Never  Vaping Use   Vaping Use: Never used  Substance Use Topics   Alcohol use: Yes    Alcohol/week: 1.0 - 2.0 standard drink    Types: 1 - 2 Glasses of wine per week    Comment: occas   Drug use: No     Family Hx: The patient's family history includes Breast cancer (age of onset: 46) in her mother; Cancer in her paternal grandmother; Diabetes in her mother; Hyperlipidemia in her father; Hypertension in her mother; Osteoarthritis in her mother.  ROS:   Please see the history of present illness.     All other systems reviewed and are negative.   Prior CV studies:   The following studies were reviewed today:  PAP compliance download  Labs/Other Tests and Data Reviewed:    EKG:   No ECG reviewed.  Recent Labs: No results found for requested labs within last 8760 hours.   Recent Lipid Panel Lab Results  Component Value Date/Time   CHOL 164 01/06/2012 09:25 AM   TRIG 157 (H) 01/06/2012 09:25 AM   HDL 62 01/06/2012 09:25 AM   CHOLHDL 2.6 01/06/2012 09:25 AM   LDLCALC 71 01/06/2012 09:25 AM    Wt Readings from Last 3 Encounters:  03/19/21 (!) 302 lb (137 kg)  11/02/20 (!) 311 lb 9.6 oz (141.3 kg)  02/13/20 297 lb 4.8 oz (134.9 kg)     Risk Assessment/Calculations:  Objective:    Vital Signs:  Ht 5' 4.5" (1.638 m)    Wt (!) 302 lb (137 kg)    BMI 51.04 kg/m    VITAL SIGNS:  reviewed GEN:  no acute distress EYES:  sclerae anicteric, EOMI - Extraocular Movements Intact RESPIRATORY:  normal respiratory effort, symmetric expansion CARDIOVASCULAR:  no peripheral edema SKIN:  no rash, lesions or ulcers. MUSCULOSKELETAL:  no obvious deformities. NEURO:  alert and oriented x 3, no obvious focal deficit PSYCH:  normal affect  ASSESSMENT & PLAN:    OSA - The patient is tolerating PAP therapy well without any problems. The PAP download performed by his DME was personally reviewed and interpreted by me today and showed an AHI of 4/hr on auto PAP with 83% compliance in using more than 4 hours nightly.  The patient has been using and benefiting from PAP use and will continue to benefit from therapy.   2.  HTN -BP is well controlled at home -Continue prescription drug management with lisinopril HCT 20-25 mg daily and amlodipine 10 mg daily with as needed refills  COVID-19 Education: The signs and symptoms of COVID-19 were discussed with the patient and how to seek care for testing (follow up with PCP or arrange E-visit).  The importance of social distancing was discussed today.  Time:   Today, I have spent 15 minutes with the patient with telehealth technology discussing the above problems.     Medication Adjustments/Labs and Tests  Ordered: Current medicines are reviewed at length with the patient today.  Concerns regarding medicines are outlined above.   Tests Ordered: No orders of the defined types were placed in this encounter.   Medication Changes: No orders of the defined types were placed in this encounter.   Follow Up:  In Person in 1 year(s)  Signed, Fransico Him, MD  03/19/2021 10:09 AM    [place]

## 2022-02-04 ENCOUNTER — Other Ambulatory Visit: Payer: Self-pay | Admitting: Family Medicine

## 2022-02-04 DIAGNOSIS — Z1231 Encounter for screening mammogram for malignant neoplasm of breast: Secondary | ICD-10-CM

## 2022-03-10 ENCOUNTER — Other Ambulatory Visit: Payer: Self-pay | Admitting: Gastroenterology

## 2022-03-12 ENCOUNTER — Other Ambulatory Visit: Payer: Self-pay | Admitting: Gastroenterology

## 2022-03-25 ENCOUNTER — Ambulatory Visit
Admission: RE | Admit: 2022-03-25 | Discharge: 2022-03-25 | Disposition: A | Discharge status: Home / Self Care | Payer: Commercial Managed Care - PPO | Source: Ambulatory Visit | Attending: Family Medicine | Admitting: Family Medicine

## 2022-03-25 DIAGNOSIS — Z1231 Encounter for screening mammogram for malignant neoplasm of breast: Secondary | ICD-10-CM

## 2022-03-31 NOTE — Progress Notes (Unsigned)
Virtual Visit via Video Note   This visit type was conducted due to national recommendations for restrictions regarding the COVID-19 Pandemic (e.g. social distancing) in an effort to limit this patient's exposure and mitigate transmission in our community.  Due to her co-morbid illnesses, this patient is at least at moderate risk for complications without adequate follow up.  This format is felt to be most appropriate for this patient at this time.  All issues noted in this document were discussed and addressed.  A limited physical exam was performed with this format.  Please refer to the patient's chart for her consent to telehealth for [facility].  Evaluation Performed:  Follow-up visit  Date:  04/01/2022   ID:  PRISTINE Brock, DOB 02/22/1960, MRN [id number]  Patient Location:  Home  Provider location:   [place]  PCP:  Harlan Stains, MD  Sleep Medicine:  Fransico Him, MD Electrophysiologist:  None   Chief Complaint:  OSA  History of Present Illness:    Kim Brock is a 62 y.o. female who presents via audio/video conferencing for a telehealth visit today.    She is doing well with her PAP device and thinks that she has gotten used to it.  She tolerates the mask and feels the pressure is adequate.  Since going on PAP she feels rested in the am and has no significant daytime sleepiness.  she denies any significant mouth or nasal dryness or nasal congestion.  She does not think that he snores.    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 Pupil looks dilated always   Hypertension    Morbid obesity with BMI of 50.0-59.9, adult ([place])     BMI 59.3   Near sighted    Wears contacts   OSA on CPAP    on auto CPAP for life   PONV (postoperative nausea and vomiting)    nausea likes zofran, pt has motion sickness   Vitamin D deficiency    Past Surgical History:  Procedure Laterality Date   [place] OF UTERUS  2011   KNEE SURGERY  2009/2010   left and right  knees arthroscopic meniscal repair   LAPAROSCOPIC GASTRIC BANDING N/A 03/30/2012   Procedure: LAPAROSCOPIC GASTRIC BANDING;  Surgeon: Gayland Curry, MD,FACS;  [facility];  Service: General;  Laterality: N/A;  Laparoscopic Adjustable Gastric Band Placement,    MESH APPLIED TO LAP PORT N/A 03/30/2012   Procedure: MESH APPLIED TO LAP PORT;  Surgeon: Gayland Curry, MD,FACS;  [facility];  Service: General;  Laterality: N/A;   PARTIAL KNEE ARTHROPLASTY Left 07/14/2016   Procedure: UNICOMPARTMENTAL LEFT KNEE;  Surgeon: Paralee Cancel, MD;  [facility];  Service: Orthopedics;  Laterality: Left;   PARTIAL KNEE ARTHROPLASTY Right 05/26/2019   Procedure: UNICOMPARTMENTAL KNEE MEDIALLY;  Surgeon: Paralee Cancel, MD;  [facility];  Service: Orthopedics;  Laterality: Right;  70 mins   TOTAL KNEE REVISION WITH SCAR DEBRIDEMENT/PATELLA REVISION WITH POLY EXCHANGE Right 02/13/2020   Procedure:  Right unicompartmental arthroplasty poly revision;  Surgeon: Paralee Cancel, MD;  [facility];  Service: Orthopedics;  Laterality: Right;  90 mins   TUBAL LIGATION  2001   uterine ablation     5 years ago     No outpatient medications have been marked as taking for the 04/01/22 encounter (Video Visit) with Kim Margarita, MD.     Allergies:   Chlorhexidine, Tape, Demerol [meperidine], Epinephrine, Percocet [oxycodone-acetaminophen], and Keflex [cephalexin]   Social History   Tobacco Use   Smoking status: Former    Packs/day: 1.00    Years: 11.00    Total pack years: 11.00    Types: Cigarettes    Quit date: 08/06/1991    Years since  quitting: 30.6   Smokeless tobacco: Never  Vaping Use   Vaping Use: Never used  Substance Use Topics   Alcohol use: Yes    Alcohol/week: 1.0 - 2.0 standard drink of alcohol    Types: 1 - 2 Glasses of wine per week    Comment: occas   Drug use: No     Family Hx: The patient's family history includes Breast cancer in her paternal aunt; Breast cancer (age of onset: 81) in her mother; Cancer in her paternal grandmother; Diabetes in her mother; Hyperlipidemia in her father; Hypertension in her mother; Osteoarthritis in her mother.  ROS:   Please see the history of present illness.     All other systems reviewed and are negative.   Labs/Other Tests and Data Reviewed:    Recent Labs: No results found for requested labs within last 365 days.   Recent Lipid Panel Lab Results  Component Value Date/Time   CHOL 164 01/06/2012 09:25 AM   TRIG 157 (H) 01/06/2012 09:25 AM   HDL 62 01/06/2012 09:25 AM   CHOLHDL 2.6 01/06/2012 09:25 AM   LDLCALC 71 01/06/2012 09:25 AM    Wt Readings from Last 3 Encounters:  04/01/22 299 lb (135.6 kg)  03/19/21 (!) 302 lb (137 kg)  11/02/20 (!) 311 lb 9.6 oz (141.3 kg)     Objective:    Vital Signs:  BP 110/70   Ht 5' 4.5" (1.638 m)   Wt 299 lb (135.6 kg)   BMI 50.53 kg/m    Well nourished, well developed female in no acute distress. Well appearing, alert and conversant, regular work of breathing,  good skin color  Eyes- anicteric mouth- oral mucosa is pink  neuro- grossly intact skin- no apparent rash or lesions or cyanosis  ASSESSMENT & PLAN:    1.  OSA - The patient is tolerating PAP therapy well without any problems. The PAP download performed by his DME was personally reviewed and interpreted by me today and showed an AHI of 3.8 /hr on auto CPAP from 4-20 cm H2O with 100% compliance in using more than 4 hours nightly.  The patient has been using and benefiting from PAP use and will continue to benefit from therapy.   2.  Hypertension   -BP is controlled at home -Continue prescription drug management with amlodipine 10 mg daily, lisinopril HCT 20-25 mg daily with as needed refills    Time:   Today, I have spent 20 minutes on telemedicine discussing medical problems including OSA, HTN, obesity.  We also reviewed the symptoms of COVID 19 and the ways to protect against contracting the virus with telehealth technology.    Medication Adjustments/Labs and Tests Ordered: Current medicines are reviewed at length with the patient today.  Concerns regarding medicines are outlined above.  Tests Ordered: No orders of the defined types were placed in this encounter.  Medication Changes: No orders of the defined types were placed in this encounter.   Disposition:  Follow up in 1 year(s)  Signed, Fransico Him, MD  04/01/2022 9:17 AM    [facility]

## 2022-04-01 ENCOUNTER — Encounter: Payer: Self-pay | Admitting: Cardiology

## 2022-04-01 ENCOUNTER — Ambulatory Visit: Payer: Commercial Managed Care - PPO | Attending: Cardiology | Admitting: Cardiology

## 2022-04-01 VITALS — BP 110/70 | Ht 64.5 in | Wt 299.0 lb

## 2022-04-01 DIAGNOSIS — G4733 Obstructive sleep apnea (adult) (pediatric): Secondary | ICD-10-CM

## 2022-04-01 DIAGNOSIS — I10 Essential (primary) hypertension: Secondary | ICD-10-CM | POA: Diagnosis not present

## 2022-04-01 NOTE — Patient Instructions (Signed)
Medication Instructions:  Your physician recommends that you continue on your current medications as directed. Please refer to the Current Medication list given to you today.  *If you need a refill on your cardiac medications before your next appointment, please call your pharmacy*   Follow-Up: At [facility], you and your health needs are our priority.  As part of our continuing mission to provide you with exceptional heart care, we have created designated Provider Care Teams.  These Care Teams include your primary Cardiologist (physician) and Advanced Practice Providers (APPs -  Physician Assistants and Nurse Practitioners) who all work together to provide you with the care you need, when you need it.    Your next appointment:   12 month(s)  Provider:   Dr. Radford Pax

## 2022-05-07 ENCOUNTER — Encounter (HOSPITAL_COMMUNITY): Payer: Self-pay | Admitting: Gastroenterology

## 2022-05-13 ENCOUNTER — Ambulatory Visit (HOSPITAL_COMMUNITY)
Admission: RE | Admit: 2022-05-13 | Discharge: 2022-05-13 | Disposition: A | Discharge status: Home / Self Care | Payer: Commercial Managed Care - PPO | Attending: Gastroenterology | Admitting: Gastroenterology

## 2022-05-13 ENCOUNTER — Encounter (HOSPITAL_COMMUNITY)
Admission: RE | Disposition: A | Discharge status: Home / Self Care | Payer: Self-pay | Source: Home / Self Care | Attending: Gastroenterology

## 2022-05-13 ENCOUNTER — Other Ambulatory Visit: Payer: Self-pay

## 2022-05-13 ENCOUNTER — Ambulatory Visit (HOSPITAL_BASED_OUTPATIENT_CLINIC_OR_DEPARTMENT_OTHER): Payer: Commercial Managed Care - PPO | Admitting: Certified Registered"

## 2022-05-13 ENCOUNTER — Encounter (HOSPITAL_COMMUNITY): Payer: Self-pay | Admitting: Gastroenterology

## 2022-05-13 ENCOUNTER — Ambulatory Visit (HOSPITAL_COMMUNITY): Payer: Commercial Managed Care - PPO | Admitting: Certified Registered"

## 2022-05-13 DIAGNOSIS — Z6841 Body Mass Index (BMI) 40.0 and over, adult: Secondary | ICD-10-CM | POA: Diagnosis not present

## 2022-05-13 DIAGNOSIS — Z7984 Long term (current) use of oral hypoglycemic drugs: Secondary | ICD-10-CM | POA: Insufficient documentation

## 2022-05-13 DIAGNOSIS — K648 Other hemorrhoids: Secondary | ICD-10-CM | POA: Diagnosis not present

## 2022-05-13 DIAGNOSIS — J45909 Unspecified asthma, uncomplicated: Secondary | ICD-10-CM | POA: Diagnosis not present

## 2022-05-13 DIAGNOSIS — K635 Polyp of colon: Secondary | ICD-10-CM | POA: Diagnosis not present

## 2022-05-13 DIAGNOSIS — Z83719 Family history of colon polyps, unspecified: Secondary | ICD-10-CM

## 2022-05-13 DIAGNOSIS — I10 Essential (primary) hypertension: Secondary | ICD-10-CM | POA: Diagnosis not present

## 2022-05-13 DIAGNOSIS — F418 Other specified anxiety disorders: Secondary | ICD-10-CM

## 2022-05-13 DIAGNOSIS — F32A Depression, unspecified: Secondary | ICD-10-CM | POA: Insufficient documentation

## 2022-05-13 DIAGNOSIS — K5730 Diverticulosis of large intestine without perforation or abscess without bleeding: Secondary | ICD-10-CM

## 2022-05-13 DIAGNOSIS — M1990 Unspecified osteoarthritis, unspecified site: Secondary | ICD-10-CM | POA: Insufficient documentation

## 2022-05-13 DIAGNOSIS — D125 Benign neoplasm of sigmoid colon: Secondary | ICD-10-CM

## 2022-05-13 DIAGNOSIS — Z800 Family history of malignant neoplasm of digestive organs: Secondary | ICD-10-CM

## 2022-05-13 DIAGNOSIS — G4730 Sleep apnea, unspecified: Secondary | ICD-10-CM | POA: Diagnosis not present

## 2022-05-13 DIAGNOSIS — Z87891 Personal history of nicotine dependence: Secondary | ICD-10-CM | POA: Insufficient documentation

## 2022-05-13 DIAGNOSIS — Z1211 Encounter for screening for malignant neoplasm of colon: Secondary | ICD-10-CM | POA: Insufficient documentation

## 2022-05-13 DIAGNOSIS — F419 Anxiety disorder, unspecified: Secondary | ICD-10-CM | POA: Diagnosis not present

## 2022-05-13 HISTORY — PX: COLONOSCOPY WITH PROPOFOL: SHX5780

## 2022-05-13 HISTORY — PX: POLYPECTOMY: SHX5525

## 2022-05-13 HISTORY — PX: BIOPSY: SHX5522

## 2022-05-13 LAB — GLUCOSE, CAPILLARY: Glucose-Capillary: 104 mg/dL — ABNORMAL HIGH (ref 70–99)

## 2022-05-13 SURGERY — COLONOSCOPY WITH PROPOFOL
Anesthesia: Monitor Anesthesia Care

## 2022-05-13 MED ORDER — PROPOFOL 500 MG/50ML IV EMUL
INTRAVENOUS | Status: AC
  Filled 2022-05-13: qty 50

## 2022-05-13 MED ORDER — LACTATED RINGERS IV SOLN: INTRAVENOUS | Status: DC | PRN

## 2022-05-13 MED ORDER — PROPOFOL 10 MG/ML IV BOLUS
INTRAVENOUS | Status: DC | PRN
  Administered 2022-05-13: 20 mg via INTRAVENOUS

## 2022-05-13 MED ORDER — SODIUM CHLORIDE 0.9 % IV SOLN: INTRAVENOUS | Status: DC

## 2022-05-13 MED ORDER — LIDOCAINE 2% (20 MG/ML) 5 ML SYRINGE
INTRAMUSCULAR | Status: DC | PRN
  Administered 2022-05-13: 60 mg via INTRAVENOUS

## 2022-05-13 MED ORDER — LACTATED RINGERS IV SOLN: Freq: Once | INTRAVENOUS | Status: DC

## 2022-05-13 MED ORDER — PROPOFOL 500 MG/50ML IV EMUL
INTRAVENOUS | Status: DC | PRN
  Administered 2022-05-13: 150 ug/kg/min via INTRAVENOUS

## 2022-05-13 SURGICAL SUPPLY — 22 items

## 2022-05-13 NOTE — Progress Notes (Signed)
Kim Brock 11:27 AM  Subjective: Patient doing well not any GI complaints and her previous colonoscopy reviewed she is not on any aspirin or blood thinners and has no other complaints no major medical issues since she was seen in our office recently  Objective: Vital signs stable afebrile no acute distress exam please see preassessment evaluation  Assessment: Family history of colon polyps due for colonic screening  Plan: Okay to proceed with colonoscopy with anesthesia assistance  [facility] E  office [phone] After 5PM or if no answer call [phone]

## 2022-05-13 NOTE — Discharge Instructions (Addendum)
Call if question or problem and follow-up as needed otherwise call for biopsy report in 1 to 2 weeks

## 2022-05-13 NOTE — Op Note (Signed)
[facility] Patient Name: Kim ChromanSheila Brock Procedure Date: 05/13/2022 MRN: [id number] Attending MD: Vida RiggerMarc Emeka Lindner , MD, [phone] Date of Birth: 1960/08/21 CSN: [id number] Age: 62 Admit Type: Outpatient Procedure:                Colonoscopy Indications:              Screening for colon cancer: Family history of                            colorectal cancer in distant relative(s), Colon                            cancer screening in patient at increased risk:                            Family history of 1st-degree relative with colon                            polyps, Last colonoscopy: August 2015 Providers:                Vida RiggerMarc Bulmaro Feagans, MD, Weston SettleBethany Thacker, RN, Beryle BeamsJanie Billups,                            Technician, Evelina DunMelanie Good CRNA Referring MD:              Medicines:                Monitored Anesthesia Care Complications:            No immediate complications. Estimated Blood Loss:     Estimated blood loss: none. Procedure:                Pre-Anesthesia Assessment:                           - Prior to the procedure, a History and Physical                            was performed, and patient medications and                            allergies were reviewed. The patient's tolerance of                            previous anesthesia was also reviewed. The risks                            and benefits of the procedure and the sedation                            options and risks were discussed with the patient.                            All questions were answered, and informed consent  was obtained. Prior Anticoagulants: The patient has                            taken no anticoagulant or antiplatelet agents. ASA                            Grade Assessment: III - A patient with severe                            systemic disease. After reviewing the risks and                            benefits, the patient was deemed in satisfactory                             condition to undergo the procedure.                           After obtaining informed consent, the colonoscope                            was passed under direct vision. Throughout the                            procedure, the patient's blood pressure, pulse, and                            oxygen saturations were monitored continuously. The                            CF-HQ190L ([id number]) Olympus colonoscope was                            introduced through the anus and advanced to the the                            cecum, identified by appendiceal orifice and                            ileocecal valve. The colonoscopy was performed                            without difficulty. The patient tolerated the                            procedure well. The quality of the bowel                            preparation was adequate. Scope In: 11:32:42 AM Scope Out: 11:57:52 AM Scope Withdrawal Time: 0 hours 18 minutes 46 seconds  Total Procedure Duration: 0 hours 25 minutes 10 seconds  Findings:      The digital rectal exam was normal.      Internal hemorrhoids were found during retroflexion, during perianal  exam and during digital exam. The hemorrhoids were small.      A few small-mouthed diverticula were found in the sigmoid colon.      A small polyp was found in the distal sigmoid colon. The polyp was       semi-sessile. The polyp was removed with a cold snare. Polyp resection       was incomplete. The resected tissue was retrieved. Biopsies were taken       with a cold forceps for histology to ensure we got it all.      The exam was otherwise without abnormality. Impression:               - Internal hemorrhoids.                           - Diverticulosis in the sigmoid colon.                           - One small polyp in the distal sigmoid colon,                            removed with a cold snare. Incomplete resection.                            Resected tissue retrieved.  Biopsied.                           - The examination was otherwise normal. Moderate Sedation:      Not Applicable - Patient had care per Anesthesia. Recommendation:           - Patient has a contact number available for                            emergencies. The signs and symptoms of potential                            delayed complications were discussed with the                            patient. Return to normal activities tomorrow.                            Written discharge instructions were provided to the                            patient.                           - Soft diet today.                           - Continue present medications.                           - Await pathology results.                           -  Repeat colonoscopy in 5-10 years for surveillance                            based on pathology results.                           - Return to GI office PRN.                           - Telephone GI clinic for pathology results in 1                            week.                           - Telephone GI clinic if symptomatic PRN. Procedure Code(s):        --- Professional ---                           [phone], Colonoscopy, flexible; with removal of                            tumor(s), polyp(s), or other lesion(s) by snare                            technique Diagnosis Code(s):        --- Professional ---                           Z12.11, Encounter for screening for malignant                            neoplasm of colon                           Z80.0, Family history of malignant neoplasm of                            digestive organs                           Z83.71, Family history of colonic polyps                           D12.5, Benign neoplasm of sigmoid colon CPT copyright 2022 [employer]. All rights reserved. The codes documented in this report are preliminary and upon coder review may  be revised to meet current compliance  requirements. Vida Rigger, MD 05/13/2022 12:21:24 PM This report has been signed electronically. Number of Addenda: 0

## 2022-05-13 NOTE — Anesthesia Preprocedure Evaluation (Addendum)
Anesthesia Evaluation  Patient identified by MRN, date of birth, ID band Patient awake    Reviewed: Allergy & Precautions, NPO status , Patient's Chart, lab work & pertinent test results  History of Anesthesia Complications (+) PONV and history of anesthetic complications  Airway Mallampati: II  TM Distance: >3 FB Neck ROM: Full    Dental  (+) Dental Advisory Given, Teeth Intact   Pulmonary asthma , sleep apnea , former smoker   Pulmonary exam normal breath sounds clear to auscultation       Cardiovascular hypertension, Normal cardiovascular exam Rhythm:Regular Rate:Normal     Neuro/Psych  PSYCHIATRIC DISORDERS Anxiety Depression    negative neurological ROS     GI/Hepatic negative GI ROS, Neg liver ROS,,,  Endo/Other  diabetes, Oral Hypoglycemic Agents  Morbid obesity  Renal/GU negative Renal ROS     Musculoskeletal  (+) Arthritis ,    Abdominal  (+) + obese  Peds  Hematology negative hematology ROS (+)   Anesthesia Other Findings   Reproductive/Obstetrics                             Anesthesia Physical Anesthesia Plan  ASA: 3  Anesthesia Plan: MAC   Post-op Pain Management:    Induction: Intravenous  PONV Risk Score and Plan: 3 and Ondansetron, Propofol infusion, Treatment may vary due to age or medical condition and TIVA  Airway Management Planned:   Additional Equipment:   Intra-op Plan:   Post-operative Plan:   Informed Consent: I have reviewed the patients History and Physical, chart, labs and discussed the procedure including the risks, benefits and alternatives for the proposed anesthesia with the patient or authorized representative who has indicated his/her understanding and acceptance.     Dental advisory given  Plan Discussed with: CRNA  Anesthesia Plan Comments:         Anesthesia Quick Evaluation

## 2022-05-13 NOTE — Anesthesia Procedure Notes (Signed)
Procedure Name: MAC Date/Time: 05/13/2022 11:25 AM  Performed by: Sindy Guadeloupe, CRNAPre-anesthesia Checklist: Patient identified, Emergency Drugs available, Suction available, Patient being monitored and Timeout performed Oxygen Delivery Method: Simple face mask Placement Confirmation: positive ETCO2

## 2022-05-13 NOTE — Anesthesia Postprocedure Evaluation (Signed)
Anesthesia Post Note  Patient: Kim Brock  Procedure(s) Performed: COLONOSCOPY WITH PROPOFOL POLYPECTOMY     Patient location during evaluation: PACU Anesthesia Type: MAC Level of consciousness: awake and alert Pain management: pain level controlled Vital Signs Assessment: post-procedure vital signs reviewed and stable Respiratory status: spontaneous breathing Cardiovascular status: stable Anesthetic complications: no   No notable events documented.  Last Vitals:  Vitals:   05/13/22 1045 05/13/22 1205  BP: (!) 154/78 (!) 104/57  Pulse:  75  Resp: 13 18  Temp: 36.6 C   SpO2: 99% 100%    Last Pain:  Vitals:   05/13/22 1205  TempSrc: Temporal  PainSc: 0-No pain                 Lewie Loron

## 2022-05-13 NOTE — Transfer of Care (Signed)
Immediate Anesthesia Transfer of Care Note  Patient: Kim Brock  Procedure(s) Performed: COLONOSCOPY WITH PROPOFOL POLYPECTOMY  Patient Location: PACU and Endoscopy Unit  Anesthesia Type:MAC  Level of Consciousness: awake, alert , and patient cooperative  Airway & Oxygen Therapy: Patient Spontanous Breathing and Patient connected to face mask oxygen  Post-op Assessment: Report given to RN and Post -op Vital signs reviewed and stable  Post vital signs: Reviewed and stable  Last Vitals:  Vitals Value Taken Time  BP 104/57   Temp    Pulse 74   Resp 14   SpO2 100     Last Pain:  Vitals:   05/13/22 1122  TempSrc:   PainSc: 0-No pain         Complications: No notable events documented.

## 2022-05-15 LAB — SURGICAL PATHOLOGY

## 2022-05-18 ENCOUNTER — Encounter (HOSPITAL_COMMUNITY): Payer: Self-pay | Admitting: Gastroenterology

## 2022-12-04 ENCOUNTER — Other Ambulatory Visit: Payer: Self-pay | Admitting: Family Medicine

## 2022-12-04 DIAGNOSIS — E2839 Other primary ovarian failure: Secondary | ICD-10-CM

## 2023-03-23 ENCOUNTER — Other Ambulatory Visit: Payer: Self-pay | Admitting: Family Medicine

## 2023-03-23 DIAGNOSIS — Z1231 Encounter for screening mammogram for malignant neoplasm of breast: Secondary | ICD-10-CM

## 2023-03-30 ENCOUNTER — Ambulatory Visit
Admission: RE | Admit: 2023-03-30 | Discharge: 2023-03-30 | Disposition: A | Discharge status: Home / Self Care | Payer: Commercial Managed Care - PPO | Source: Ambulatory Visit | Attending: Family Medicine | Admitting: Family Medicine

## 2023-03-30 DIAGNOSIS — Z1231 Encounter for screening mammogram for malignant neoplasm of breast: Secondary | ICD-10-CM

## 2023-05-21 ENCOUNTER — Ambulatory Visit: Payer: Commercial Managed Care - PPO | Attending: Cardiology | Admitting: Cardiology

## 2023-05-21 ENCOUNTER — Encounter: Payer: Self-pay | Admitting: Cardiology

## 2023-05-21 ENCOUNTER — Telehealth: Payer: Self-pay | Admitting: *Deleted

## 2023-05-21 VITALS — BP 120/78 | HR 98 | Ht 64.0 in | Wt 299.6 lb

## 2023-05-21 DIAGNOSIS — G4733 Obstructive sleep apnea (adult) (pediatric): Secondary | ICD-10-CM

## 2023-05-21 DIAGNOSIS — I10 Essential (primary) hypertension: Secondary | ICD-10-CM | POA: Diagnosis not present

## 2023-05-21 NOTE — Addendum Note (Signed)
 Addended by: CHAUVIGNE, Tonnia Bardin on: 05/21/2023 09:59 AM   Modules accepted: Orders

## 2023-05-21 NOTE — Patient Instructions (Addendum)
 Medication Instructions:  Your physician recommends that you continue on your current medications as directed. Please refer to the Current Medication list given to you today.  *If you need a refill on your cardiac medications before your next appointment, please call your pharmacy*  Testing/Procedures: Calcium Score CT Your physician has requested that you have a calcium score CT scan. There is a $99 fee for the scan.   Follow-Up: At [facility], you and your health needs are our priority.  As part of our continuing mission to provide you with exceptional heart care, our providers are all part of one team.  This team includes your primary Cardiologist (physician) and Advanced Practice Providers or APPs (Physician Assistants and Nurse Practitioners) who all work together to provide you with the care you need, when you need it.  Your next appointment:   1 year  Provider:   Gaylyn Keas, MD     1st Floor: - Lobby - Registration  - Pharmacy  - Lab - Cafe  2nd Floor: - PV Lab - Diagnostic Testing (echo, CT, nuclear med)  3rd Floor: - Vacant  4th Floor: - TCTS (cardiothoracic surgery) - AFib Clinic - Structural Heart Clinic - Vascular Surgery  - Vascular Ultrasound  5th Floor: - HeartCare Cardiology (general and EP) - Clinical Pharmacy for coumadin, hypertension, lipid, weight-loss medications, and med management appointments    Valet parking services will be available as well.

## 2023-05-21 NOTE — Telephone Encounter (Addendum)
 Download from in person appointment on 4/17.  Per Dr Micael Adas, please call patient and let her know her download looked great.

## 2023-05-21 NOTE — Progress Notes (Signed)
 Date:  05/21/2023   ID:  Kim Brock, DOB 1960/04/19, MRN [id number]   PCP:  Victorio Grave, MD  Sleep Medicine:  Gaylyn Keas, MD Electrophysiologist:  None   Chief Complaint:  OSA  History of Present Illness:    Kim Brock is a 63 y.o. female with a history of obstructive sleep apnea on CPAP therapy, asthma, diabetes mellitus, hypertension, morbid obesity.  She is here for follow-up of obstructive sleep apnea.  Shs is doing well with her PAP device except recently when she had a bad asthma flare and now had an albuterol nebulizer when needed.  She tolerates the mask and feels the pressure is adequate.  Since going on PAP she feels rested in the am and has no significant daytime sleepiness if she has slept well the night before.  She denies any significant mouth or nasal dryness or nasal congestion.  She does not think that she snores.    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 Pupil looks dilated always   Hypertension    Morbid obesity with BMI of 50.0-59.9, adult (HCC)    BMI 59.3   Near sighted    Wears contacts   OSA on CPAP    on auto CPAP for life   PONV (postoperative nausea and vomiting)    nausea likes zofran, pt has motion sickness   Vitamin D deficiency    Past Surgical History:  Procedure Laterality Date   BIOPSY  05/13/2022   Procedure: BIOPSY;  Surgeon: Ozell Blunt, MD;  Location: [facility];  Service: Gastroenterology;;   CHOLECYSTECTOMY  1999   COLONOSCOPY WITH PROPOFOL N/A 05/13/2022   Procedure: COLONOSCOPY WITH PROPOFOL;  Surgeon: Ozell Blunt, MD;  Location: [facility];  Service:  Gastroenterology;  Laterality: N/A;   DILATION AND CURETTAGE OF UTERUS  2011   KNEE SURGERY  2009/2010   left and right  knees arthroscopic meniscal repair   LAPAROSCOPIC GASTRIC BANDING N/A 03/30/2012   Procedure: LAPAROSCOPIC GASTRIC BANDING;  Surgeon: Fran Imus, MD,FACS;  Location: [facility];  Service: General;  Laterality: N/A;  Laparoscopic Adjustable Gastric Band Placement,    MESH APPLIED TO LAP PORT N/A 03/30/2012   Procedure: MESH APPLIED TO LAP PORT;  Surgeon: Fran Imus, MD,FACS;  Location: [facility];  Service: General;  Laterality: N/A;   PARTIAL KNEE ARTHROPLASTY Left 07/14/2016   Procedure: UNICOMPARTMENTAL LEFT KNEE;  Surgeon: Claiborne Crew, MD;  Location: [facility];  Service: Orthopedics;  Laterality: Left;   PARTIAL KNEE ARTHROPLASTY Right 05/26/2019   Procedure: UNICOMPARTMENTAL KNEE MEDIALLY;  Surgeon: Claiborne Crew, MD;  Location: [facility];  Service: Orthopedics;  Laterality: Right;  70 mins   POLYPECTOMY  05/13/2022   Procedure: POLYPECTOMY;  Surgeon: Ozell Blunt, MD;  Location: [facility];  Service: Gastroenterology;;   TOTAL KNEE REVISION WITH SCAR DEBRIDEMENT/PATELLA REVISION WITH POLY EXCHANGE Right 02/13/2020   Procedure: Right unicompartmental arthroplasty poly revision;  Surgeon: Claiborne Crew, MD;  Location: [facility];  Service: Orthopedics;  Laterality: Right;  90 mins   TUBAL  LIGATION  2001   uterine ablation     5 years ago     Current Meds  Medication Sig   acetaminophen (TYLENOL) 500 MG tablet Take 500 mg by mouth at bedtime.   albuterol (PROVENTIL HFA;VENTOLIN HFA) 108 (90 BASE) MCG/ACT inhaler Inhale 2 puffs into the lungs every 6 (six) hours as needed for shortness of breath.   amLODipine (NORVASC) 10 MG tablet Take 10 mg by mouth in the morning.   atorvastatin (LIPITOR) 10 MG tablet Take 10 mg by mouth at bedtime.   Cholecalciferol (VITAMIN D3) 125 MCG (5000 UT) TABS Take 5,000 Units by mouth at bedtime.   clonazePAM (KLONOPIN) 0.5 MG tablet Take 0.5 mg by mouth  daily as needed for anxiety.   diclofenac (VOLTAREN) 75 MG EC tablet Take 75 mg by mouth 2 (two) times daily.   escitalopram (LEXAPRO) 10 MG tablet Take 30 mg by mouth in the morning.   FLOVENT DISKUS 100 MCG/BLIST AEPB Inhale 1 puff into the lungs 2 (two) times daily.   lamoTRIgine (LAMICTAL) 100 MG tablet Take 100 mg by mouth in the morning.   lisinopril-hydrochlorothiazide (PRINZIDE,ZESTORETIC) 20-25 MG tablet Take 1 tablet by mouth in the morning.   RYBELSUS 7 MG TABS Take 7 mg by mouth in the morning.   Zinc 100 MG TABS Take 100 mg by mouth at bedtime.     Allergies:   Chlorhexidine, Tape, Demerol [meperidine], Epinephrine, Percocet [oxycodone-acetaminophen], and Keflex [cephalexin]   Social History   Tobacco Use   Smoking status: Former    Current packs/day: 0.00    Average packs/day: 1 pack/day for 11.0 years (11.0 ttl pk-yrs)    Types: Cigarettes    Start date: 08/05/1980    Quit date: 08/06/1991    Years since quitting: 31.8   Smokeless tobacco: Never  Vaping Use   Vaping status: Never Used  Substance Use Topics   Alcohol use: Yes    Alcohol/week: 1.0 - 2.0 standard drink of alcohol    Types: 1 - 2 Glasses of wine per week    Comment: occas   Drug use: No     Family Hx: The patient's family history includes Breast cancer in her paternal aunt; Breast cancer (age of onset: 62) in her mother; Cancer in her paternal grandmother; Diabetes in her mother; Hyperlipidemia in her father; Hypertension in her mother; Osteoarthritis in her mother.  ROS:   Please see the history of present illness.     All other systems reviewed and are negative.   Labs/Other Tests and Data Reviewed:    Recent Labs: No results found for requested labs within last 365 days.   Recent Lipid Panel Lab Results  Component Value Date/Time   CHOL 164 01/06/2012 09:25 AM   TRIG 157 (H) 01/06/2012 09:25 AM   HDL 62 01/06/2012 09:25 AM   CHOLHDL 2.6 01/06/2012 09:25 AM   LDLCALC 71 01/06/2012 09:25  AM    Wt Readings from Last 3 Encounters:  05/21/23 299 lb 9.6 oz (135.9 kg)  05/13/22 298 lb (135.2 kg)  04/01/22 299 lb (135.6 kg)     Objective:    Vital Signs:  BP 120/78   Pulse 98   Ht 5\' 4"  (1.626 m)   Wt 299 lb 9.6 oz (135.9 kg)   SpO2 98%   BMI 51.43 kg/m    GEN: Well nourished, well developed in no acute distress HEENT: Normal NECK: No JVD; No carotid bruits LYMPHATICS: No lymphadenopathy CARDIAC:RRR, no murmurs, rubs,  gallops RESPIRATORY:  Clear to auscultation without rales, wheezing or rhonchi  ABDOMEN: Soft, non-tender, non-distended MUSCULOSKELETAL:  No edema; No deformity  SKIN: Warm and dry NEUROLOGIC:  Alert and oriented x 3 PSYCHIATRIC:  Normal affect  ASSESSMENT & PLAN:    OSA - The patient is tolerating PAP therapy well without any problems. The patient has been using and benefiting from PAP use and will continue to benefit from therapy.  -I will get a download from her DME  Hypertension  - BP is adequately controlled - Continue prescription drug management with amlodipine 10 mg daily and lisinopril HCT 20-25 mg daily with as needed refills - I am going to get a coronary Ca score to assess future cardiac risk  Time:   Today, I have spent 20 minutes on telemedicine discussing medical problems including OSA, HTN, obesity.  We also reviewed the symptoms of COVID 19 and the ways to protect against contracting the virus with telehealth technology.    Medication Adjustments/Labs and Tests Ordered: Current medicines are reviewed at length with the patient today.  Concerns regarding medicines are outlined above.  Tests Ordered: No orders of the defined types were placed in this encounter.  Medication Changes: No orders of the defined types were placed in this encounter.   Disposition:  Follow up in 1 year(s)  Signed, Gaylyn Keas, MD  05/21/2023 9:44 AM    [facility]

## 2023-06-09 ENCOUNTER — Other Ambulatory Visit (HOSPITAL_COMMUNITY)

## 2023-07-02 ENCOUNTER — Ambulatory Visit (HOSPITAL_BASED_OUTPATIENT_CLINIC_OR_DEPARTMENT_OTHER)
Admission: RE | Admit: 2023-07-02 | Discharge: 2023-07-02 | Disposition: A | Discharge status: Home / Self Care | Payer: Self-pay | Source: Ambulatory Visit | Attending: Cardiology | Admitting: Cardiology

## 2023-07-02 DIAGNOSIS — G4733 Obstructive sleep apnea (adult) (pediatric): Secondary | ICD-10-CM | POA: Insufficient documentation

## 2023-07-02 DIAGNOSIS — I10 Essential (primary) hypertension: Secondary | ICD-10-CM | POA: Insufficient documentation

## 2023-07-03 ENCOUNTER — Ambulatory Visit: Payer: Self-pay | Admitting: Cardiology

## 2023-07-03 DIAGNOSIS — I3139 Other pericardial effusion (noninflammatory): Secondary | ICD-10-CM

## 2023-07-07 NOTE — Telephone Encounter (Signed)
 MC to advise patient to coronary calcium  score of 0, but pericardiac effusion present and Dr. Micael Adas recommends echo to evaluate. Order placed.

## 2023-07-07 NOTE — Telephone Encounter (Signed)
-----   Message from Kim Brock sent at 07/03/2023  9:34 AM EDT ----- Coronary calcium  score 0.  She does have a small to moderate pericardial effusion on CT.  Please have her come in for 2D echo to assess pericardial effusion further

## 2023-07-21 ENCOUNTER — Ambulatory Visit: Payer: Self-pay | Admitting: Cardiology

## 2023-07-21 ENCOUNTER — Ambulatory Visit (HOSPITAL_COMMUNITY)
Admission: RE | Admit: 2023-07-21 | Discharge: 2023-07-21 | Disposition: A | Discharge status: Home / Self Care | Source: Ambulatory Visit | Attending: Cardiovascular Disease | Admitting: Cardiovascular Disease

## 2023-07-21 ENCOUNTER — Telehealth: Payer: Self-pay | Admitting: Cardiology

## 2023-07-21 DIAGNOSIS — I3139 Other pericardial effusion (noninflammatory): Secondary | ICD-10-CM

## 2023-07-21 LAB — ECHOCARDIOGRAM COMPLETE: S' Lateral: 2.52 cm

## 2023-07-21 NOTE — Telephone Encounter (Signed)
 Patient is calling about her echo results. Informed patient Dr. Micael Adas has not had a chance to review these yet. We will call to let her know what Dr. Micael Adas recommends after reviewing results.  Patient is also asking for an update regarding her CPAP machine. She states she was told her CPAP is not activated in the system where Dr. Micael Adas can see her CPAP machine readings.  Will forward to our sleep team to follow-up with patient about this.

## 2023-07-21 NOTE — Telephone Encounter (Signed)
 Patient called to follow-up on test results and the status of her CPAP machine order.

## 2023-07-22 NOTE — Telephone Encounter (Signed)
-----   Message from Gaylyn Keas sent at 07/21/2023  5:01 PM EDT ----- Repeat limited echo in 6 months for small pericardial effusion ----- Message ----- From: Interface, [employer] Sent: 07/21/2023   2:11 PM EDT To: Jacqueline Matsu, MD

## 2023-07-22 NOTE — Telephone Encounter (Signed)
 MC message regarding echo results, 6 mo f/u echo ordered for pericardial effusion.

## 2023-08-13 ENCOUNTER — Inpatient Hospital Stay (HOSPITAL_BASED_OUTPATIENT_CLINIC_OR_DEPARTMENT_OTHER): Admission: RE | Admit: 2023-08-13 | Source: Ambulatory Visit

## 2023-11-16 ENCOUNTER — Other Ambulatory Visit: Payer: Commercial Managed Care - PPO

## 2024-01-21 ENCOUNTER — Ambulatory Visit (HOSPITAL_COMMUNITY): Admission: RE | Admit: 2024-01-21 | Discharge: 2024-01-21 | Attending: Cardiology | Admitting: Cardiology

## 2024-01-21 DIAGNOSIS — I3139 Other pericardial effusion (noninflammatory): Secondary | ICD-10-CM

## 2024-01-21 LAB — ECHOCARDIOGRAM LIMITED: S' Lateral: 3.3 cm
